# Patient Record
Sex: Female | Born: 1970
Health system: Southern US, Community
[De-identification: ages and names within clinical notes are randomized; demographics above are authoritative.]

## PROBLEM LIST (undated history)

## (undated) DIAGNOSIS — R109 Unspecified abdominal pain: Secondary | ICD-10-CM

## (undated) DIAGNOSIS — R002 Palpitations: Secondary | ICD-10-CM

## (undated) DIAGNOSIS — K219 Gastro-esophageal reflux disease without esophagitis: Secondary | ICD-10-CM

## (undated) DIAGNOSIS — N201 Calculus of ureter: Secondary | ICD-10-CM

## (undated) DIAGNOSIS — R10A1 Flank pain, right side: Secondary | ICD-10-CM

## (undated) DIAGNOSIS — D509 Iron deficiency anemia, unspecified: Secondary | ICD-10-CM

## (undated) DIAGNOSIS — J45909 Unspecified asthma, uncomplicated: Secondary | ICD-10-CM

## (undated) DIAGNOSIS — M069 Rheumatoid arthritis, unspecified: Secondary | ICD-10-CM

## (undated) DIAGNOSIS — J309 Allergic rhinitis, unspecified: Secondary | ICD-10-CM

## (undated) DIAGNOSIS — Z87442 Personal history of urinary calculi: Secondary | ICD-10-CM

## (undated) DIAGNOSIS — E039 Hypothyroidism, unspecified: Secondary | ICD-10-CM

---

## 1996-11-14 HISTORY — PX: TUBAL LIGATION: SHX77

## 2005-11-14 HISTORY — PX: PERCUTANEOUS NEPHROSTOLITHOTOMY: SHX2207

## 2006-03-06 ENCOUNTER — Ambulatory Visit: Payer: Self-pay | Admitting: Internal Medicine

## 2007-10-19 ENCOUNTER — Ambulatory Visit: Payer: Self-pay | Admitting: Family Medicine

## 2007-10-31 ENCOUNTER — Ambulatory Visit: Payer: Self-pay | Admitting: Gastroenterology

## 2008-07-14 ENCOUNTER — Encounter: Admission: RE | Admit: 2008-07-14 | Discharge: 2008-07-14 | Payer: Self-pay | Admitting: Endocrinology

## 2008-08-12 ENCOUNTER — Encounter (INDEPENDENT_AMBULATORY_CARE_PROVIDER_SITE_OTHER): Payer: Self-pay | Admitting: Interventional Radiology

## 2008-08-12 ENCOUNTER — Encounter: Admission: RE | Admit: 2008-08-12 | Discharge: 2008-08-12 | Payer: Self-pay | Admitting: Endocrinology

## 2008-08-12 ENCOUNTER — Other Ambulatory Visit: Admission: RE | Admit: 2008-08-12 | Discharge: 2008-08-12 | Payer: Self-pay | Admitting: Interventional Radiology

## 2008-08-18 ENCOUNTER — Ambulatory Visit: Payer: Self-pay | Admitting: Family Medicine

## 2008-10-22 ENCOUNTER — Ambulatory Visit: Payer: Self-pay | Admitting: Family Medicine

## 2008-11-14 HISTORY — PX: WISDOM TOOTH EXTRACTION: SHX21

## 2009-02-27 ENCOUNTER — Encounter: Admission: RE | Admit: 2009-02-27 | Discharge: 2009-02-27 | Payer: Self-pay | Admitting: Endocrinology

## 2009-08-20 ENCOUNTER — Ambulatory Visit: Payer: Self-pay | Admitting: Family Medicine

## 2009-12-24 ENCOUNTER — Ambulatory Visit: Payer: Self-pay | Admitting: Family Medicine

## 2010-01-19 ENCOUNTER — Ambulatory Visit: Payer: Self-pay | Admitting: Family Medicine

## 2010-05-12 ENCOUNTER — Encounter: Admission: RE | Admit: 2010-05-12 | Discharge: 2010-05-12 | Payer: Self-pay | Admitting: Anesthesiology

## 2010-06-01 ENCOUNTER — Ambulatory Visit (HOSPITAL_BASED_OUTPATIENT_CLINIC_OR_DEPARTMENT_OTHER): Admission: RE | Admit: 2010-06-01 | Discharge: 2010-06-01 | Payer: Self-pay | Admitting: Orthopedic Surgery

## 2010-06-01 HISTORY — PX: OTHER SURGICAL HISTORY: SHX169

## 2010-06-18 ENCOUNTER — Ambulatory Visit (HOSPITAL_COMMUNITY): Admission: RE | Admit: 2010-06-18 | Discharge: 2010-06-18 | Payer: Self-pay | Admitting: Anesthesiology

## 2010-07-21 ENCOUNTER — Encounter: Admission: RE | Admit: 2010-07-21 | Discharge: 2010-07-21 | Payer: Self-pay | Admitting: Anesthesiology

## 2010-08-20 ENCOUNTER — Ambulatory Visit: Payer: Self-pay

## 2010-10-14 HISTORY — PX: HYSTEROSCOPY W/ ENDOMETRIAL ABLATION: SUR665

## 2010-10-27 ENCOUNTER — Ambulatory Visit: Payer: Self-pay | Admitting: Obstetrics and Gynecology

## 2010-11-02 ENCOUNTER — Ambulatory Visit: Payer: Self-pay | Admitting: Obstetrics and Gynecology

## 2010-12-29 ENCOUNTER — Ambulatory Visit: Payer: Self-pay | Admitting: Family Medicine

## 2011-07-22 ENCOUNTER — Ambulatory Visit: Payer: Self-pay | Admitting: Family Medicine

## 2011-08-18 ENCOUNTER — Ambulatory Visit: Payer: Self-pay | Admitting: Family Medicine

## 2011-08-26 ENCOUNTER — Ambulatory Visit: Payer: Self-pay | Admitting: Allergy and Immunology

## 2012-01-18 ENCOUNTER — Ambulatory Visit: Payer: Self-pay | Admitting: Family Medicine

## 2012-06-14 ENCOUNTER — Ambulatory Visit: Payer: Self-pay | Admitting: Family Medicine

## 2012-08-19 ENCOUNTER — Emergency Department: Payer: Self-pay | Admitting: Emergency Medicine

## 2012-08-19 LAB — URINALYSIS, COMPLETE
Bacteria: NONE SEEN
Glucose,UR: NEGATIVE mg/dL (ref 0–75)
Nitrite: NEGATIVE
Specific Gravity: 1.035 (ref 1.003–1.030)
Squamous Epithelial: 2

## 2012-08-19 LAB — CBC
HCT: 41.7 % (ref 35.0–47.0)
MCHC: 33.7 g/dL (ref 32.0–36.0)
MCV: 80 fL (ref 80–100)
Platelet: 206 10*3/uL (ref 150–440)
RDW: 15.2 % — ABNORMAL HIGH (ref 11.5–14.5)
WBC: 8.9 10*3/uL (ref 3.6–11.0)

## 2012-08-19 LAB — BASIC METABOLIC PANEL
Anion Gap: 8 (ref 7–16)
BUN: 24 mg/dL — ABNORMAL HIGH (ref 7–18)
Co2: 26 mmol/L (ref 21–32)
Creatinine: 1.07 mg/dL (ref 0.60–1.30)
EGFR (African American): >60
EGFR (Non-African Amer.): >60
Glucose: 91 mg/dL (ref 65–99)

## 2012-08-22 ENCOUNTER — Encounter (HOSPITAL_BASED_OUTPATIENT_CLINIC_OR_DEPARTMENT_OTHER): Payer: Self-pay | Admitting: *Deleted

## 2012-08-22 ENCOUNTER — Other Ambulatory Visit: Payer: Self-pay | Admitting: Urology

## 2012-08-23 ENCOUNTER — Encounter (HOSPITAL_BASED_OUTPATIENT_CLINIC_OR_DEPARTMENT_OTHER): Payer: Self-pay | Admitting: *Deleted

## 2012-08-24 ENCOUNTER — Encounter (HOSPITAL_BASED_OUTPATIENT_CLINIC_OR_DEPARTMENT_OTHER): Payer: Self-pay | Admitting: *Deleted

## 2012-08-24 NOTE — Progress Notes (Signed)
NPO AFTER MN. ARRIVES AT 1015. NEEDS ISTAT, EKG, URINE PREG. WILL TAKE PREDNISONE , SYNTHROID AND DO ADVAIR INHALER , NASAL SPRAY AM OF SURG W/ SIPS OF WATER. IF NEEDED MAY TAKE PERCOCET AND PHENERGAN . WILL BRING RESCUE INHALER.

## 2012-08-25 ENCOUNTER — Ambulatory Visit: Payer: Self-pay | Admitting: Urology

## 2012-08-25 ENCOUNTER — Inpatient Hospital Stay: Payer: Self-pay | Admitting: Family Medicine

## 2012-08-25 LAB — URINALYSIS, COMPLETE
Leukocyte Esterase: NEGATIVE
Nitrite: NEGATIVE
Ph: 5 (ref 4.5–8.0)
Protein: NEGATIVE
RBC,UR: 89 /HPF (ref 0–5)

## 2012-08-25 LAB — BASIC METABOLIC PANEL
BUN: 20 mg/dL — ABNORMAL HIGH (ref 7–18)
Chloride: 103 mmol/L (ref 98–107)
Creatinine: 0.86 mg/dL (ref 0.60–1.30)
EGFR (African American): >60
EGFR (Non-African Amer.): >60
Glucose: 116 mg/dL — ABNORMAL HIGH (ref 65–99)
Osmolality: 285 (ref 275–301)
Potassium: 3.9 mmol/L (ref 3.5–5.1)
Sodium: 141 mmol/L (ref 136–145)

## 2012-08-25 LAB — CBC
MCH: 26.7 pg (ref 26.0–34.0)
MCHC: 33.7 g/dL (ref 32.0–36.0)

## 2012-08-26 LAB — CBC WITH DIFFERENTIAL/PLATELET
Basophil #: 0 10*3/uL (ref 0.0–0.1)
Lymphocyte %: 18.4 %
MCHC: 32.7 g/dL (ref 32.0–36.0)
Monocyte %: 10.9 %
Neutrophil %: 69.1 %
Platelet: 161 10*3/uL (ref 150–440)
RDW: 15.6 % — ABNORMAL HIGH (ref 11.5–14.5)
WBC: 7 10*3/uL (ref 3.6–11.0)

## 2012-08-26 LAB — BASIC METABOLIC PANEL
Calcium, Total: 8.1 mg/dL — ABNORMAL LOW (ref 8.5–10.1)
Chloride: 106 mmol/L (ref 98–107)
Glucose: 91 mg/dL (ref 65–99)
Osmolality: 278 (ref 275–301)
Potassium: 3.8 mmol/L (ref 3.5–5.1)

## 2012-08-27 ENCOUNTER — Encounter (HOSPITAL_BASED_OUTPATIENT_CLINIC_OR_DEPARTMENT_OTHER): Payer: Self-pay | Admitting: Anesthesiology

## 2012-08-27 ENCOUNTER — Encounter (HOSPITAL_BASED_OUTPATIENT_CLINIC_OR_DEPARTMENT_OTHER): Admission: RE | Payer: Self-pay | Source: Ambulatory Visit

## 2012-08-27 ENCOUNTER — Ambulatory Visit (HOSPITAL_BASED_OUTPATIENT_CLINIC_OR_DEPARTMENT_OTHER): Admission: RE | Admit: 2012-08-27 | Payer: 59 | Source: Ambulatory Visit | Admitting: Urology

## 2012-08-27 HISTORY — DX: Palpitations: R00.2

## 2012-08-27 HISTORY — DX: Unspecified abdominal pain: R10.9

## 2012-08-27 HISTORY — DX: Hypothyroidism, unspecified: E03.9

## 2012-08-27 HISTORY — DX: Unspecified asthma, uncomplicated: J45.909

## 2012-08-27 HISTORY — DX: Personal history of urinary calculi: Z87.442

## 2012-08-27 HISTORY — DX: Iron deficiency anemia, unspecified: D50.9

## 2012-08-27 HISTORY — DX: Allergic rhinitis, unspecified: J30.9

## 2012-08-27 HISTORY — DX: Rheumatoid arthritis, unspecified: M06.9

## 2012-08-27 HISTORY — DX: Flank pain, right side: R10.A1

## 2012-08-27 HISTORY — DX: Gastro-esophageal reflux disease without esophagitis: K21.9

## 2012-08-27 HISTORY — DX: Calculus of ureter: N20.1

## 2012-08-27 SURGERY — CYSTOSCOPY, WITH CALCULUS REMOVAL USING BASKET
Anesthesia: General | Laterality: Right

## 2012-08-27 NOTE — Anesthesia Preprocedure Evaluation (Deleted)
Anesthesia Evaluation  Patient identified by MRN, date of birth, ID band Patient awake    Reviewed: Allergy & Precautions, H&P , NPO status , Patient's Chart, lab work & pertinent test results  Airway       Dental   Pulmonary asthma ,          Cardiovascular - CAD, - Past MI and - CHF     Neuro/Psych negative neurological ROS  negative psych ROS   GI/Hepatic GERD-  Medicated,  Endo/Other  Hypothyroidism   Renal/GU      Musculoskeletal  (+) Arthritis -, Rheumatoid disorders,    Abdominal   Peds  Hematology  (+) Blood dyscrasia, anemia ,   Anesthesia Other Findings   Reproductive/Obstetrics                         Anesthesia Physical Anesthesia Plan  ASA: III  Anesthesia Plan: General   Post-op Pain Management:    Induction: Intravenous  Airway Management Planned: LMA  Additional Equipment:   Intra-op Plan:   Post-operative Plan: Extubation in OR  Informed Consent: I have reviewed the patients History and Physical, chart, labs and discussed the procedure including the risks, benefits and alternatives for the proposed anesthesia with the patient or authorized representative who has indicated his/her understanding and acceptance.   Dental advisory given  Plan Discussed with: CRNA  Anesthesia Plan Comments:         Anesthesia Quick Evaluation

## 2013-01-15 ENCOUNTER — Ambulatory Visit: Payer: Self-pay | Admitting: Family Medicine

## 2013-01-17 ENCOUNTER — Ambulatory Visit: Payer: Self-pay | Admitting: Family Medicine

## 2013-01-21 ENCOUNTER — Ambulatory Visit
Admission: RE | Admit: 2013-01-21 | Discharge: 2013-01-21 | Disposition: A | Discharge status: Home / Self Care | Payer: 59 | Source: Ambulatory Visit | Attending: Anesthesiology | Admitting: Anesthesiology

## 2013-01-21 ENCOUNTER — Other Ambulatory Visit: Payer: Self-pay | Admitting: Anesthesiology

## 2013-01-21 DIAGNOSIS — M25551 Pain in right hip: Secondary | ICD-10-CM

## 2013-04-01 ENCOUNTER — Other Ambulatory Visit: Payer: Self-pay | Admitting: Endocrinology

## 2013-04-01 DIAGNOSIS — E041 Nontoxic single thyroid nodule: Secondary | ICD-10-CM

## 2013-04-02 ENCOUNTER — Ambulatory Visit
Admission: RE | Admit: 2013-04-02 | Discharge: 2013-04-02 | Disposition: A | Discharge status: Home / Self Care | Payer: 59 | Source: Ambulatory Visit | Attending: Endocrinology | Admitting: Endocrinology

## 2013-04-02 DIAGNOSIS — E041 Nontoxic single thyroid nodule: Secondary | ICD-10-CM

## 2013-07-26 ENCOUNTER — Ambulatory Visit: Payer: Self-pay | Admitting: Family Medicine

## 2013-09-26 ENCOUNTER — Encounter: Payer: Self-pay | Admitting: Podiatry

## 2013-10-03 ENCOUNTER — Encounter: Payer: Self-pay | Admitting: Podiatrist

## 2013-10-03 ENCOUNTER — Ambulatory Visit (INDEPENDENT_AMBULATORY_CARE_PROVIDER_SITE_OTHER): Payer: 59 | Admitting: Podiatrist

## 2013-10-03 VITALS — BP 143/85 | HR 76 | Resp 16 | Ht 65.0 in | Wt 206.0 lb

## 2013-10-03 DIAGNOSIS — S92309A Fracture of unspecified metatarsal bone(s), unspecified foot, initial encounter for closed fracture: Secondary | ICD-10-CM | POA: Insufficient documentation

## 2013-10-03 NOTE — Progress Notes (Signed)
  Chief Complaint  Patient presents with  . Foot Pain    MY RIGHT FOOT IS BROKE     HPI: Patient is 42 y.o. female who presents today for a new problem- she has recently been diagnosed with a fracture of her 5th metatarsal of the right foot.  Selena Batten has rheumatoid arthritis and does not recall when she may have injured her foot.  Xrays were taken 09/23/2013 by Dr Zenovia Jordan at [facility] which revealed a fractured 5th metatarsal base with no known injury.  Patient has been wearing her normal shoes.  Overall she relates diffuse discomfort but no specific area of pain the the right foot.  Selena Batten states her left foot is doing fine after this fracture has healed.       Physical Exam  GENERAL APPEARANCE: Alert, conversant. Appropriately groomed. No acute distress.  VASCULAR: Pedal pulses palpable and strong bilateral.  Capillary refill time is immediate to all digits,  Proximal to distal cooling it warm to warm.  Digital hair growth is present bilateral  NEUROLOGIC: sensation is intact epicritically and protectively to 5.07 monofilament at 5/5 sites bilateral.  Light touch is intact bilateral, vibratory sensation intact bilateral, achilles tendon reflex is intact bilateral.  MUSCULOSKELETAL: acceptable muscle strength, tone and stability bilateral.  Intrinsic muscluature intact bilateral.  Rectus appearance of foot and digits noted bilateral. Slight swelling at the 5th metatarsal region of the right foot is noted.  Some discomfort over the area of fracture is also seen.  No obvious deformity present  DERMATOLOGIC: skin color, texture, and turger are within normal limits.  No preulcerative lesions are seen, no interdigital maceration noted.  No open lesions present.   X-rays were reviewed and are positive for a fracture of the 5th metatarsal at the proximal diaphyseal region of the base.  No dislocation present.    Assessment: Fracture of the 5th metatarsal base right foot  Plan:   The patient has a short air fracture walker from the previous fracture of her left foot. I recommended that she wear this boot on her right foot at all times during weightbearing. I will see her back in one month for followup and re\re x-ray of this right foot. She finds that her boot at home is uncomfortable or worn out, she will let us know and I will get her a new boot for her right foot.   Victorina Kable P

## 2013-10-03 NOTE — Patient Instructions (Signed)
Wear your short air fracture walker any time your foot hits the floor-  It is important to keep weight off of your fracture.

## 2013-10-14 ENCOUNTER — Encounter: Payer: Self-pay | Admitting: Podiatrist

## 2013-10-31 ENCOUNTER — Ambulatory Visit (INDEPENDENT_AMBULATORY_CARE_PROVIDER_SITE_OTHER): Payer: 59

## 2013-10-31 ENCOUNTER — Ambulatory Visit (INDEPENDENT_AMBULATORY_CARE_PROVIDER_SITE_OTHER): Payer: 59 | Admitting: Podiatrist

## 2013-10-31 ENCOUNTER — Encounter: Payer: Self-pay | Admitting: Podiatrist

## 2013-10-31 VITALS — BP 144/86 | HR 77 | Resp 16 | Ht 65.0 in | Wt 204.0 lb

## 2013-10-31 DIAGNOSIS — M79609 Pain in unspecified limb: Secondary | ICD-10-CM

## 2013-10-31 DIAGNOSIS — M79671 Pain in right foot: Secondary | ICD-10-CM

## 2013-10-31 DIAGNOSIS — S92309A Fracture of unspecified metatarsal bone(s), unspecified foot, initial encounter for closed fracture: Secondary | ICD-10-CM

## 2013-10-31 NOTE — Progress Notes (Signed)
Subjective: Patient presents today for followup of fifth metatarsal fracture of the right foot. She states she's been wearing her boot and the foot been feeling better. She has been offloading this foot since I saw her at the last visit about 3 weeks ago. She also states that she recently fractured her left knee and she's found out today. In the past she's had another fracture on her foot and she's been checked for bone density she's getting ready to have another test   objective: Neurovascular status is unchanged discomfort along the right fifth metatarsal is noted. X-rays taken and evaluated and reveal a fracture still present  Assessment: Fracture fifth metatarsal right foot mid diaphyseal region  Plan: Instructed the patient to wear the boot for another month and I'll see her back then for another x-ray discussed her bone density and the question as to why she's fracturing. She's going to set up a visit again for another bone density test and hopefully this will add to the diagnosis and reasoning behind her fractures.

## 2013-10-31 NOTE — Patient Instructions (Signed)
Keep wearing your boot for the next 4 weeks consistently

## 2013-11-28 ENCOUNTER — Encounter: Payer: Self-pay | Admitting: Podiatrist

## 2013-11-28 ENCOUNTER — Ambulatory Visit (INDEPENDENT_AMBULATORY_CARE_PROVIDER_SITE_OTHER): Payer: 59 | Admitting: Podiatrist

## 2013-11-28 ENCOUNTER — Ambulatory Visit (INDEPENDENT_AMBULATORY_CARE_PROVIDER_SITE_OTHER): Payer: 59

## 2013-11-28 VITALS — BP 136/80 | HR 58 | Resp 16 | Ht 65.0 in | Wt 198.0 lb

## 2013-11-28 DIAGNOSIS — M79609 Pain in unspecified limb: Secondary | ICD-10-CM

## 2013-11-28 DIAGNOSIS — M79672 Pain in left foot: Secondary | ICD-10-CM

## 2013-11-28 DIAGNOSIS — S92309A Fracture of unspecified metatarsal bone(s), unspecified foot, initial encounter for closed fracture: Secondary | ICD-10-CM

## 2013-11-28 DIAGNOSIS — M79673 Pain in unspecified foot: Secondary | ICD-10-CM

## 2013-11-28 NOTE — Patient Instructions (Signed)
Continue to wear the boot on your right foot-- i will re-xray the foot then and if it hasn't healed completely, we can consider a bone stimulator to help it progress faster.

## 2013-11-28 NOTE — Progress Notes (Addendum)
Subjective: Carrie Santiago presents today for followup fracture fifth metatarsal right foot.  She states the right foot feeling okay however now she has pain again in the left foot. She previously had a fracture that went on to heal at the second metatarsal left foot. She denies any trauma or injury to either foot. She states she has been wearing the boot as instructed on the right foot.   Objective: Neurovascular status intact and unchanged bilaterally. No pain present along the lateral aspect of the right foot. No swelling or ecchymosis is present. The left foot does have pain midshaft of the second metatarsal and dorsally with pressure. X-rays are taken of bilateral feet and reveal a non-healing fracture of the fifth metatarsal right, and a recurrence of fracture second metatarsal left foot.  Assessment: Fifth metatarsal fracture right- nonhealing, relapsing second metatarsal fracture  Plan: Recommended continued use of the air fracture walker on the right foot. We'll recommend use of a surgical shoe on the left as well. I will see her back in one month in because of her issue with fracture healing I do believe a bone stimulator would be beneficial. I will go ahead and check on benefits for bone stimulator at this time and will notify the patient as well.

## 2013-11-29 ENCOUNTER — Telehealth: Payer: Self-pay | Admitting: *Deleted

## 2013-11-29 NOTE — Telephone Encounter (Signed)
Marlowe AschoffKathryn Egerton, DPM Birdie HopesAlicia Gale Zetta BillsSmith            Aleynah Rocchio,   Call Cala BradfordKimberly and let her know that the 2nd metatarsal of the left foot appears to have re-fractured where it was healing. Tell her to go back into a surgical shoe for this foot (if she doesn't have one she can come pick one up)   Also, can you call Thurston Poundsrey for a check on a bone stimulator? Let Cala BradfordKimberly know we are going to go ahead and look into this for her now.   Thanks! E      Called and left message on cell phone regarding the above information. Called and spoke with trey, trey stated he would go by [place] office and get dr. Irving ShowsEgerton signature and get all demographics printed at that location.

## 2013-12-03 DIAGNOSIS — S92309A Fracture of unspecified metatarsal bone(s), unspecified foot, initial encounter for closed fracture: Secondary | ICD-10-CM

## 2013-12-19 ENCOUNTER — Ambulatory Visit: Payer: Self-pay | Admitting: Family Medicine

## 2013-12-26 ENCOUNTER — Ambulatory Visit (INDEPENDENT_AMBULATORY_CARE_PROVIDER_SITE_OTHER): Payer: 59 | Admitting: Podiatrist

## 2013-12-26 ENCOUNTER — Ambulatory Visit (INDEPENDENT_AMBULATORY_CARE_PROVIDER_SITE_OTHER): Payer: 59

## 2013-12-26 VITALS — BP 124/76 | HR 69 | Resp 16 | Ht 65.0 in | Wt 197.0 lb

## 2013-12-26 DIAGNOSIS — M79609 Pain in unspecified limb: Secondary | ICD-10-CM

## 2013-12-26 DIAGNOSIS — M79673 Pain in unspecified foot: Secondary | ICD-10-CM

## 2013-12-26 DIAGNOSIS — S92309A Fracture of unspecified metatarsal bone(s), unspecified foot, initial encounter for closed fracture: Secondary | ICD-10-CM

## 2013-12-27 NOTE — Progress Notes (Signed)
Subjective: Carrie Santiago presents today for followup of fracture fifth metatarsal base right and re\re fracture second metatarsal left. She states that she has been using the bone stimulator on her right foot and that her left foot has been improving. She has been wearing her [employer] Shoes on her left foot and her air fracture walker on her right. She's been on long-term prednisone use   Objective: Neurovascular status intact and unchanged. Minimal discomfort at the base of the fifth metatarsal noted. No redness, no swelling, no ecchymosis present. Minimal discomfort midshaft left second metatarsal. No redness, no swelling, no ecchymosis present. X-rays show fracture fifth metatarsal right and second metatarsal left  Assessment: Fracture metatarsal 5 right and second metatarsal left Plan: Recommended the use of bone stimulator on both feet. Recommended she start to wear the boot on her left foot and wean out of it on her right. I will see her back in one month for rex-ray and recheck of fractures.

## 2014-02-03 ENCOUNTER — Ambulatory Visit: Payer: Self-pay | Admitting: Family Medicine

## 2014-02-20 ENCOUNTER — Ambulatory Visit (INDEPENDENT_AMBULATORY_CARE_PROVIDER_SITE_OTHER): Payer: 59

## 2014-02-20 ENCOUNTER — Ambulatory Visit (INDEPENDENT_AMBULATORY_CARE_PROVIDER_SITE_OTHER): Payer: 59 | Admitting: Podiatrist

## 2014-02-20 VITALS — Resp 16 | Ht 65.0 in | Wt 196.0 lb

## 2014-02-20 DIAGNOSIS — M79673 Pain in unspecified foot: Secondary | ICD-10-CM

## 2014-02-20 DIAGNOSIS — M79609 Pain in unspecified limb: Secondary | ICD-10-CM

## 2014-02-20 DIAGNOSIS — IMO0001 Reserved for inherently not codable concepts without codable children: Secondary | ICD-10-CM

## 2014-02-20 DIAGNOSIS — S92309A Fracture of unspecified metatarsal bone(s), unspecified foot, initial encounter for closed fracture: Secondary | ICD-10-CM

## 2014-02-20 DIAGNOSIS — S92354G Nondisplaced fracture of fifth metatarsal bone, right foot, subsequent encounter for fracture with delayed healing: Secondary | ICD-10-CM

## 2014-02-20 NOTE — Patient Instructions (Signed)
Keep using your bone stimulator on your right foot.  Your left foot is looking better on xray but your right is lagging behind a bit.  We will decide if it needs a screw placed across it at the next visit.

## 2014-02-20 NOTE — Progress Notes (Signed)
Subjective: Cala BradfordKimberly presents today for followup of second metatarsal fracture left; and fifth metatarsal fracture right. She states that the left foot is feeling okay but now she's having more pain on the right foot. She denies any new trauma or injury to the foot. She has been wearing a Darco shoe on the right foot and a normal shoe on the left.  Objective: Neurovascular status is unchanged. Clinical appearance of bilateral feet is normal. Mild pain with pressure at the fifth metatarsal at the area of fracture is noted. No pain second metatarsal elicited. X-rays taken bilaterally today. Fifth metatarsal fracture appears unchanged. No improvement in healing is seen on x-ray.  Assessment: Fracture fifth metatarsal right nonhealing fracture second metatarsal left  Plan: Recommended she go back into the boot on the right foot. Recommended continued use of her bone stimulator. Recommended a screw fixation on the fifth metatarsal due to the fact that it is not healing and because of the location of the fracture. Her husband is getting ready to have back surgery and this is not a good time for her to have surgery herself. I will see her back in 4-6 weeks for re\re evaluation and re\re x-ray and we will decide at that point if surgery is still recommended.

## 2014-04-03 ENCOUNTER — Ambulatory Visit (INDEPENDENT_AMBULATORY_CARE_PROVIDER_SITE_OTHER): Payer: 59

## 2014-04-03 ENCOUNTER — Ambulatory Visit (INDEPENDENT_AMBULATORY_CARE_PROVIDER_SITE_OTHER): Payer: 59 | Admitting: Podiatrist

## 2014-04-03 VITALS — BP 128/84 | HR 68 | Resp 16

## 2014-04-03 DIAGNOSIS — S92309A Fracture of unspecified metatarsal bone(s), unspecified foot, initial encounter for closed fracture: Secondary | ICD-10-CM

## 2014-04-03 NOTE — Patient Instructions (Signed)
Get a pair of hiking type shoes from [employer] or an outdoor store to help support your injured feet.  If they start to hurt again, put the boot back on--

## 2014-04-05 NOTE — Progress Notes (Signed)
Subjective: Carrie Santiago presents today for followup of second metatarsal fracture left; and fifth metatarsal fracture right. She states that both of her feet are doing well.  She continues to wear the sort air fracture walker on the right foot. denies any new trauma or injury to the foot. She wears a normal shoe on the left.  She is on predinsone and methotrexate which slows her healing potential  Objective: Neurovascular status is unchanged. Clinical appearance of bilateral feet is normal. no pain with pressure at the fifth metatarsal at the area of fracture is noted. No pain second metatarsal elicited. X-rays taken bilaterally today. Fifth metatarsal fracture appears to be improved on xray-- the central portion of the fracture is healing while the most lateral aspect is still open.   Assessment: Fracture fifth metatarsal right nonhealing fracture second metatarsal left   Plan:Recommended continued use of her bone stimulator. Discussed weaning out of the boot into a darco shoe in a couple of weeks.  She will let me know if the pain returns.  In the past we discussed surgery however at this time, it finally appears to be improving.

## 2015-01-09 ENCOUNTER — Ambulatory Visit: Payer: Self-pay | Admitting: Podiatrist

## 2015-03-03 NOTE — Consult Note (Signed)
Chief Complaint:   Subjective/Chief Complaint HD #1 Right Ureteral Stone with Refractory Colic  No new complaints. Persistent RLQ/Flank pain requiring parenteral narcotics - nausea with po Dilaudid.   VITAL SIGNS/ANCILLARY NOTES: **Vital Signs.:   13-Oct-13 06:48   Vital Signs Type Routine   Temperature Temperature (F) 98   Celsius 36.6   Temperature Source Oral   Pulse Pulse 69   Respirations Respirations 18   Systolic BP Systolic BP 263   Diastolic BP (mmHg) Diastolic BP (mmHg) 71   Mean BP 88   Pulse Ox % Pulse Ox % 94   Pulse Ox Activity Level  At rest   Oxygen Delivery Room Air/ 21 %  *Intake and Output.:   Shift 13-Oct-13 07:00   Grand Totals Intake:  1479 Output:  550    Net:  929 24 Hr.:  -194   Oral Intake      In:  120   IV (Primary)      In:  1359   Urine ml     Out:  550   Length of Stay Totals Intake:  1881 Output:  2075    Net:  -194    Shift 15:00   Grand Totals Intake:  240 Output:  150    Net:  90 24 Hr.:  90   Oral Intake      In:  240   Urine ml     Out:  150   Length of Stay Totals Intake:  2121 Output:  2225    Net:  -104   Brief Assessment:   Additional Physical Exam WDWN WF in NAD, appearing tired. nL Respiratory Effort   Lab Results: Routine Chem:  13-Oct-13 05:09    Glucose, Serum 91   BUN 11   Creatinine (comp) 0.96   Sodium, Serum 140   Potassium, Serum 3.8   Chloride, Serum 106   CO2, Serum 28   Calcium (Total), Serum  8.1   Anion Gap  6   Osmolality (calc) 278   eGFR (African American) >60   eGFR (Non-African American) >60 (eGFR values <8m/min/1.73 m2 may be an indication of chronic kidney disease (CKD). Calculated eGFR is useful in patients with stable renal function. The eGFR calculation will not be reliable in acutely ill patients when serum creatinine is changing rapidly. It is not useful in  patients on dialysis. The eGFR calculation may not be applicable to patients at the low and high extremes of body sizes,  pregnant women, and vegetarians.)  Routine Hem:  13-Oct-13 05:09    WBC (CBC) 7.0   RBC (CBC) 4.81   Hemoglobin (CBC) 12.6   Hematocrit (CBC) 38.5   Platelet Count (CBC) 161   MCV 80   MCH 26.2   MCHC 32.7   RDW  15.6   Neutrophil % 69.1   Lymphocyte % 18.4   Monocyte % 10.9   Eosinophil % 1.4   Basophil % 0.2   Neutrophil # 4.8   Lymphocyte # 1.3   Monocyte # 0.8   Eosinophil # 0.1   Basophil # 0.0 (Result(s) reported on 26 Aug 2012 at 05:56AM.)   Assessment/Plan:  Assessment/Plan:   Assessment Right Ureteral Stone with Refractory Colic -continues to require parenteral narcotics -pt opts to continue with parenteral pain management until tomorrow morning when she is scheduled [facility]. No new recommendations 2. Will sign off - please reconsult as needed   Electronic  Signatures: Darcella Cheshire (MD)  (Signed 13-Oct-13 11:42)  Authored: Chief Complaint, VITAL SIGNS/ANCILLARY NOTES, Brief Assessment, Lab Results, Assessment/Plan   Last Updated: 13-Oct-13 11:42 by Darcella Cheshire (MD)

## 2015-03-03 NOTE — Discharge Summary (Signed)
PATIENT NAME:  Carrie CampbellORRIS, Salley J MR#:  [id number] DATE OF BIRTH:  27-Jan-1971  DATE OF ADMISSION:  08/25/2012 DATE OF DISCHARGE:  08/26/2012  DISPOSITION: Home.   FOLLOW-UP: [facility] at 10:30.   DIAGNOSES: 1. Nephrolithiasis. 2. Hydronephrosis. 3. Rheumatoid arthritis.  4. Intractable pain. 5. Severe nausea and vomiting.   6. Asthma.  7. Iron deficiency anemia. 8. Hyperglycemia due to steroids.  9. Dehydration, improved after IV fluids given.  MEDICATIONS AT DISCHARGE:  1. Prednisone 5 mg once daily.  2. Synthroid 150 mcg once a day.  3. Singulair 10 mg once daily.  4. Vicoprofen 7.5/200 mg oral tablet. This was stopped for possible procedure, although the patient will not require the procedure anymore for what she can restart this medication. 5. Ventolin HFA 90 mcg inhaled. 6. Advair Diskus 250/50 twice daily. 7. Vitamin B12.  8. Vitamin D3. 9. Iron once daily.  10. Methotrexate 25 mg injectable once weekly on Tuesday. 11. Folic acid once daily.  12. Oxycodone 5 mg every eight hours p.r.n. pain. 13. Phenergan 25 mg p.r.n. pain. 14. Flomax 0.4 mg once a day. 15. Hydroxychloroquine 200 mg once daily.  16. Leflunomide 20 mg once daily.  17. Enbrel 50 mg subcutaneously once weekly.   HOSPITAL COURSE: [name] is a very nice 44 year old female who has nephrolithiasis. She was seen in the ER a couple of days prior to this admission and discharged with pain medications and nausea medication. While the patient was at home, she was not able to tolerate the pain. She had an appointment to have the stone removed and stent placement on Monday but she could not tolerate it and couldn't wait until then. The patient was admitted for pain control for severe dehydration, intractable pain, nausea, and vomiting. Urology was consulted. They were not able to proceed during this hospital stay and decided just to leave her in the hospital for pain control. The  patient, fortunately, passed her stone today. It was a large stone and she had complete resolution of the symptoms after the stone passed. At this moment she is able to be discharged. I recommend her to continue to keep her appointment [facility] in case there is a need for further procedures.    TIME SPENT: I spent about 35 minutes with this patient today.   ____________________________ Felipa Furnaceoberto Sanchez Gutierrez, MD rsg:drc D: 08/26/2012 16:28:31 ET T: 08/27/2012 10:10:32 ET JOB#: [id number]  cc: Felipa Furnaceoberto Sanchez Gutierrez, MD, <Dictator> Yuepheng Schaller Juanda ChanceSANCHEZ GUTIERRE MD ELECTRONICALLY SIGNED 08/28/2012 16:52

## 2015-03-03 NOTE — Consult Note (Signed)
Urology Consultation Report: for Consultation: Obstructing Right Ureteral Stone with Refractory Pain, Nausea/Vomiting MD: Felipa Furnaceoberto Sanchez Gutierrez, M.D.MD: Marin OlpJay H. Rayquan Amrhein, M.D. Nilda SimmerKristi Smith, M.D.Local Urologist: [facility] 44 y.o. MWF with h/o recurrent urolithiasis who was diagnosed with an obstructing 6.55mm right ureteral stone on CT 08/19/2012 [facility]. The pt was released from the ER with Percocet prn, Phenergan prn, nitrofurantoin bid and tamsulosin. [place] [facility] [place] and is scheduled for definitive ureteroscopy on Monday, 08/27/2012. Last night, she developed progression in her colic from a 7/10 to a 10/10 without relief with Percocet 2 po q4hrs. She also was unable to keep down solid food, but has been able to tolerate liquids. [facility] this morning and has required parenteral narcotics for pain control. She denies any dysuria or F/C and does not have a leukocystosis (WBC 7.7k). She has been admitted for IV Hydration and pain control. stone history: 1st stone in 2004 - passed spontaneously (no analysis performed). Second stone episode was in 2007 and [facility]. A metabolic stone risk analysis was performed and the pt reports that she was advised to moderate her coffee/tea intake. reviewed and is as stated in the H&P by Dr. Sharen HonesGutierrez with the addition of a FH of Urolithiasis in the pt's mother, father and paternal grandfather. With regard to the pt's medications, she states that she has been on Prednisone 5mg  po qDay for the past 3 months and that while on Prednisone, her methrotrexate and Enbrel had been held until the past 2 weeks. They were not administered this past week in anticipation of her stone surgery Monday. VS at 10:21am: 97.78F P78 BP 161/87 O2 sat 99% on RA WDWN WF in NAD, appearing tiredWarm/Dry, no lesions about the head/neckNC/AT, EOMI, Anictericno masses/bruitsCTA, nL  Respiratory EffortRRR without M/G/R, 2+ radial pulses b/LSoft, ND, tender RLQ without guarding/rebound, mild right CVAT; no masses or organomegalyno edema, nL ROM, NTnon-focalAxO x4, Appropriate BUN/Cr (20/0.86); Ca++ (8.9); WBC (7.7k) Right Ureteral Colic - difficulty with pain management and N/V, but no indication of infectionoptions with the pt: trial of alternate PO pain/nausea medications vs Ureteral Stenting -the would like to try a trial of alternate PO pain/nausea medications  Dilaudid 2-4 mg po q3-4 hrs prn (ordered)Zofran 4mg  po q4-6 hrs prn (ordered)Continue tamsulosinContinue to strain all urineConsider right ureteral stent placement if with persistent inadequate pain/nausea controlContinue IV Hydration you.  Electronic Signatures: Marin OlpKim, Regino Fournet H (MD)  (Signed on 12-Oct-13 13:26)  Authored  Last Updated: 12-Oct-13 13:26 by Marin OlpKim, Stephenia Vogan H (MD)

## 2015-03-03 NOTE — H&P (Signed)
PATIENT NAME:  Carrie Santiago, Carrie Santiago MR#:  [id number] DATE OF BIRTH:  [birth year]  DATE OF ADMISSION:  08/25/2012  PRIMARY CARE PHYSICIAN: Nilda Simmer, MD  CHIEF COMPLAINT: Right flank pain.   HISTORY OF PRESENT ILLNESS: Ms. Duchesne is a very nice 44 year old female who has history of rheumatoid arthritis, asthma, hypothyroidism, and previous kidney stones. The patient is here today with history of right flank pain that has been going on for the last week. She presented on 08/19/2012 to an urgent care and was transferred to this emergency department due to significant pain that was intractable. At that moment she was diagnosed with a kidney stone. A CT scan done in the emergency room showed mild to moderate right hydronephrosis secondary to a large calculus within the mid to proximal right ureter, possible small stone in the gallbladder as well. The patient was referred to urology and she has been scheduled to have stent placement this following Monday. The patient states that her pain just became intolerable for what she has been taking pain medications in the past. She has been taking Vicoprofen due to the kidney stones, but also due to rheumatoid arthritis and she was asked to stop it due to the possible procedure that is coming up on Monday. For that the patient has been taking OxyContin and she does not seem to have significant relief of the pain. Her pain is located in her right flank and occasionally radiates to her upper abdomen. The pain is intolerable, it is sharp, and its intensity is from 7 to 10/10; at this moment it is 7 out of 10. The patient has been getting IV medications and they seem to help, but they do not seem to relieve the pain completely. The patient has undergone previous procedures for kidney stone in the past. In 2007 the patient had failure to remove the stone through a stent placed for what she needed to have a nephrostomy tube placed and surgical removal of the stone. Since then she  has not been having that much trouble or major kidney stone episodes until now. The patient denies any fever, but she has been very nauseous and she has significant dehydration.   REVIEW OF SYSTEMS: A 12-system review of systems is done. CONSTITUTIONAL: The patient denies any fever, fatigue, or weakness. She has significant pain in her joints and now in her flank and abdomen. No significant weight loss or significant weight gain, although her weight varies especially now that she is on steroids. EYES: Denies any significant blurry vision or eye pain or glaucoma. ENT: Denies any tinnitus, any difficulty swallowing, any sinus pain or nasal congestion. RESPIRATORY: The patient does have history of asthma and she occasionally uses her rescue inhaler. The last time she used it was yesterday, only one time. She has occasional wheezing and congestion of the chest. No pneumonia. No significant dyspnea or hemoptysis. CARDIOVASCULAR: No chest pain or orthopnea. No syncopal episodes. No significant edema of the lower extremities. GASTROINTESTINAL: Denies any hematochezia, melena, or ulcers. She has mild GERD. She denies any diarrhea. The patient has been constipated due to pain medications, but her last bowel movement was yesterday. Positive nausea and vomiting. No jaundice. GU: The patient denies any significant dysuria. She has kidney stones as mentioned above. She has increased frequency and mild incontinence which is stress incontinence whenever she sneezes or does any heavy lifting. She denies any sexually transmitted diseases, breast problems, or vaginal discharge. ENDOCRINE: No polyuria, polydipsia, or polyphagia. No cold or  heat intolerance. She does take Synthroid for her thyroid, but it has been well controlled. HEMATOLOGIC/LYMPHATIC: Positive chronic iron deficiency anemia. She is status post colonoscopy which did not show any significant lesions to make her have gastrointestinal bleeding. No easy bruising. No  swollen glands. MUSCULOSKELETAL: Positive for multiple joint pains, especially hands and feet. The patient has rheumatoid arthritis. No gout. NEUROLOGIC: No numbness, weakness, no transient ischemic attack, and no headaches. PSYCHOLOGIC: No significant depression or anxiety.   PAST MEDICAL HISTORY:  1. Rheumatoid arthritis.  2. Hypothyroidism.  3. History of kidney stones.  4. Asthma.  5. Iron deficiency anemia.  6. Stress incontinence.   ALLERGIES: No known drug allergies.   MEDICATIONS:  1. Vitamin D3 1000 units once a day. 2. Vitamin B12 500 units once daily.  3. Ventolin two puffs. 4. Synthroid 150 mcg once daily. 5. Q-Nasal spray.  6. Prednisone 5 mg once daily.  7. Phenergan 25 mg every four hours p.r.n. nausea.  8. Oxycodone 5 mg as needed for pain. 9. Montelukast 10 mg once daily. 10. Methotrexate 25 mg injectable every week on Tuesday. 11. Leflunomide 20 mg once daily.  12. Iron 28 mg once daily.  13. Hydroxychloroquine 200 mg once a day. 14. Hydrocodone with ibuprofen every 6 hours; the patient has stopped this medication. 15. Folic acid 2 mg once daily.  16. Flomax 0.4 mg once daily. 17. Enbrel 1 mL subcutaneously every week on Wednesday. 18. Advair Diskus 250/50 one puff every 12 hours.   FAMILY HISTORY: Positive for coronary artery disease in her grandfather who had a myocardial infarction.  No history of cancer. Positive history of diabetes in several members of her family.   SOCIAL HISTORY: The patient used to smoke but she quit over 10 years ago. She smoked probably five cigarettes for a period of 5 to 10 years. Alcohol - she states that she drinks very rarely. She denies any IV drug abuse. She works as a Scientist, research (medical)hairstylist and she lives with her husband.   PAST SURGICAL HISTORY:  1. Bilateral tubal ligation. 2. Lithotripsy with nephrostomy tube and surgical removal of kidney stone in 2007.  PHYSICAL EXAMINATION:   VITAL SIGNS: Blood pressure 161/95, pulse 79, and  respiratory rate 18. The patient is afebrile.  GENERAL: The patient is alert and oriented x3. No acute distress. No respiratory distress. She is lethargic after IV pain medications are given but easily arousable. She looks very comfortable in bed and she is mildly dehydrated.   HEENT: Her pupils are equal and reactive. Extraocular movements are intact. Mucosa is dry. Anicteric sclerae. Pink conjunctivae. Normocephalic atraumatic. No oral lesions. No oropharyngeal exudates. No thrush.   NECK: Supple. No JVD. No thyromegaly. No adenopathy. No stridor. No carotid bruits are auscultated. Normal range of motion.   CARDIOVASCULAR: Regular rate and rhythm. No murmurs, rubs, or gallops are appreciated.  Apical impulse heart tones along midclavicular line.   PULMONARY: Clear without any wheezing or crepitus. No use of accessory muscles. No dullness to percussion.   ABDOMEN: Soft, nontender, and nondistended. No hepatosplenomegaly. No masses. Bowel sounds are positive. Positive right costovertebral angle tenderness. No rebound. No guarding. No hepatic stigmata.   GENITAL: Negative for external genital lesions.   EXTREMITIES: No significant edema, cyanosis, or swelling joints.   JOINTS: There is no significant joint swelling at this moment or erythema. No major joint deformity. No swollen hands.  NEUROLOGIC: Cranial nerves II through XII intact. No focal abnormalities. Sensation is normal.   SKIN:  No petechiae or rashes.   PSYCHIATRIC: Flat affect and the patient is mildly sedated, but no significant agitation.   LYMPHATIC: No significant lymphadenopathy in the neck, supraclavicular, or epitrochlear regions.   LABS/RADIOLOGIC STUDIES: Blood sugar 116, BUN 20, creatinine 0.86, chloride 103, and sodium 141. White count 7.7 and hemoglobin 13.4.  URINALYSIS: Red blood cells are positive. Negative nitrites and leukocyte esterase. White blood cells are only 3.  CT scan, as mentioned above, was done on  08/19/2012 and has right hydronephrosis and kidney stones.   ASSESSMENT AND PLAN:  1. Nephrolithiasis. The patient has a kidney stone on the right kidney and mild to moderate hydronephrosis and a calculus that is located in the mid proximal right ureter. In the past, the patient has had failure to get these stones removed and bilateral ureters with stents and she has required nephrostomy too and removal of the stents surgically. At this moment, the patient is scheduled to have a stent placed on Monday, but her pain is intractable and she needs to be admitted for IV pain medications for what urology has been consulted. The case was discussed with Dr. Selena Batten via the ER physician and Dr. Selena Batten has asked [place], hospitalist, to admit the patient and see if the patient can have pain control and possibly he can do a procedure over the weekend. If not at least we can arrange for pain control. There is no significant urinary retention at this moment and the patient's creatinine is actually better than it was on previous labs, at 0.86. Her BUN is 20, previous to that it was 24.  2. Intractable pain. Pain control has been obtained or almost achieved with Demerol and hydromorphone. I think at this moment we are just going to continue hydromorphone and try to get her pain better.  3. Dehydration. The patient looks moderately dehydrated due to the fact that she has not been eating. She has been very nauseated and has had vomiting. We are going to put her on IV fluids and advance diet slowly.  4. Rheumatoid arthritis. Continue Enbrel, continue leflunomide, continue methotrexate and prednisone. Pain control as needed. 5. Hyperglycemia. The patient is on steroids. Her glucose is only 116. We are going to observe and add on insulin sliding scale if necessary. I am not going to check any Accu-Cheks on her right now. We are just going to get her labs tomorrow in the morning.  6. Asthma. Continue p.r.n. nebulizers. Continue montelukast  and Advair.  7. Stress incontinence. The patient has been recently started on Flomax. We will continue this medication.  8. Anemia. Actually hemoglobin is normal. We will continue just iron supplementation.  9. Her other medical problems seem to be stable. For deep vein thrombosis prophylaxis, we are going to use TED hose. For GI prophylaxis we are going to add a PPI. The patient is a FULL CODE.   TIME SPENT: About 45 minutes. ____________________________ Felipa Furnace, MD rsg:slb D: 08/25/2012 09:16:21 ET T: 08/25/2012 09:59:18 ET JOB#: [id number]  cc: Felipa Furnace, MD, <Dictator> Myrle Sheng. Katrinka Blazing, MD Regan Rakers Juanda Chance MD ELECTRONICALLY SIGNED 08/28/2012 16:52

## 2015-07-24 ENCOUNTER — Telehealth: Payer: Self-pay | Admitting: Physician Assistant

## 2015-07-24 NOTE — Telephone Encounter (Signed)
Spoke with Daiva Nakayama and information given to patient all questions were answered.  Thanks,  -Anaijah Augsburger

## 2015-07-24 NOTE — Telephone Encounter (Signed)
Pt would like to speak with a nurse to see what the signe and symptoms of Hep A would be. The reason she is asking is because the news had showed that [employer] had some bad strawberries that was causing people to have Hep A. Pt would just like to speak with a nurse because she stated she and her husband eat there a lot. Thanks TNP

## 2015-12-22 ENCOUNTER — Other Ambulatory Visit: Payer: Self-pay | Admitting: Physician Assistant

## 2015-12-22 DIAGNOSIS — M0579 Rheumatoid arthritis with rheumatoid factor of multiple sites without organ or systems involvement: Secondary | ICD-10-CM

## 2015-12-22 DIAGNOSIS — E039 Hypothyroidism, unspecified: Secondary | ICD-10-CM

## 2015-12-22 DIAGNOSIS — R5381 Other malaise: Secondary | ICD-10-CM

## 2016-12-05 DIAGNOSIS — M0579 Rheumatoid arthritis with rheumatoid factor of multiple sites without organ or systems involvement: Secondary | ICD-10-CM | POA: Diagnosis not present

## 2016-12-13 DIAGNOSIS — G894 Chronic pain syndrome: Secondary | ICD-10-CM | POA: Diagnosis not present

## 2016-12-13 DIAGNOSIS — Z79891 Long term (current) use of opiate analgesic: Secondary | ICD-10-CM | POA: Diagnosis not present

## 2016-12-13 DIAGNOSIS — M47812 Spondylosis without myelopathy or radiculopathy, cervical region: Secondary | ICD-10-CM | POA: Diagnosis not present

## 2016-12-14 DIAGNOSIS — M0579 Rheumatoid arthritis with rheumatoid factor of multiple sites without organ or systems involvement: Secondary | ICD-10-CM | POA: Diagnosis not present

## 2016-12-14 DIAGNOSIS — Z79899 Other long term (current) drug therapy: Secondary | ICD-10-CM | POA: Diagnosis not present

## 2016-12-14 DIAGNOSIS — M2550 Pain in unspecified joint: Secondary | ICD-10-CM | POA: Diagnosis not present

## 2017-01-16 DIAGNOSIS — M0579 Rheumatoid arthritis with rheumatoid factor of multiple sites without organ or systems involvement: Secondary | ICD-10-CM | POA: Diagnosis not present

## 2017-01-16 DIAGNOSIS — Z79899 Other long term (current) drug therapy: Secondary | ICD-10-CM | POA: Diagnosis not present

## 2017-01-17 DIAGNOSIS — G894 Chronic pain syndrome: Secondary | ICD-10-CM | POA: Diagnosis not present

## 2017-01-17 DIAGNOSIS — Z79891 Long term (current) use of opiate analgesic: Secondary | ICD-10-CM | POA: Diagnosis not present

## 2017-01-17 DIAGNOSIS — M47812 Spondylosis without myelopathy or radiculopathy, cervical region: Secondary | ICD-10-CM | POA: Diagnosis not present

## 2017-02-20 DIAGNOSIS — G894 Chronic pain syndrome: Secondary | ICD-10-CM | POA: Diagnosis not present

## 2017-02-20 DIAGNOSIS — Z79891 Long term (current) use of opiate analgesic: Secondary | ICD-10-CM | POA: Diagnosis not present

## 2017-02-20 DIAGNOSIS — M47812 Spondylosis without myelopathy or radiculopathy, cervical region: Secondary | ICD-10-CM | POA: Diagnosis not present

## 2017-02-24 DIAGNOSIS — J4540 Moderate persistent asthma, uncomplicated: Secondary | ICD-10-CM | POA: Diagnosis not present

## 2017-02-24 DIAGNOSIS — J3089 Other allergic rhinitis: Secondary | ICD-10-CM | POA: Diagnosis not present

## 2017-02-24 DIAGNOSIS — J0100 Acute maxillary sinusitis, unspecified: Secondary | ICD-10-CM | POA: Diagnosis not present

## 2017-02-24 DIAGNOSIS — J301 Allergic rhinitis due to pollen: Secondary | ICD-10-CM | POA: Diagnosis not present

## 2017-02-27 DIAGNOSIS — M0579 Rheumatoid arthritis with rheumatoid factor of multiple sites without organ or systems involvement: Secondary | ICD-10-CM | POA: Diagnosis not present

## 2017-03-14 DIAGNOSIS — M0579 Rheumatoid arthritis with rheumatoid factor of multiple sites without organ or systems involvement: Secondary | ICD-10-CM | POA: Diagnosis not present

## 2017-03-14 DIAGNOSIS — Z79899 Other long term (current) drug therapy: Secondary | ICD-10-CM | POA: Diagnosis not present

## 2017-03-14 DIAGNOSIS — M2550 Pain in unspecified joint: Secondary | ICD-10-CM | POA: Diagnosis not present

## 2017-03-20 DIAGNOSIS — G894 Chronic pain syndrome: Secondary | ICD-10-CM | POA: Diagnosis not present

## 2017-03-20 DIAGNOSIS — Z79891 Long term (current) use of opiate analgesic: Secondary | ICD-10-CM | POA: Diagnosis not present

## 2017-03-20 DIAGNOSIS — M47812 Spondylosis without myelopathy or radiculopathy, cervical region: Secondary | ICD-10-CM | POA: Diagnosis not present

## 2017-04-11 DIAGNOSIS — M0579 Rheumatoid arthritis with rheumatoid factor of multiple sites without organ or systems involvement: Secondary | ICD-10-CM | POA: Diagnosis not present

## 2017-04-11 DIAGNOSIS — Z79899 Other long term (current) drug therapy: Secondary | ICD-10-CM | POA: Diagnosis not present

## 2017-04-18 DIAGNOSIS — G894 Chronic pain syndrome: Secondary | ICD-10-CM | POA: Diagnosis not present

## 2017-04-18 DIAGNOSIS — M4689 Other specified inflammatory spondylopathies, multiple sites in spine: Secondary | ICD-10-CM | POA: Diagnosis not present

## 2017-04-18 DIAGNOSIS — M47812 Spondylosis without myelopathy or radiculopathy, cervical region: Secondary | ICD-10-CM | POA: Diagnosis not present

## 2017-04-18 DIAGNOSIS — Z79891 Long term (current) use of opiate analgesic: Secondary | ICD-10-CM | POA: Diagnosis not present

## 2017-05-22 DIAGNOSIS — G894 Chronic pain syndrome: Secondary | ICD-10-CM | POA: Diagnosis not present

## 2017-05-22 DIAGNOSIS — Z79891 Long term (current) use of opiate analgesic: Secondary | ICD-10-CM | POA: Diagnosis not present

## 2017-05-22 DIAGNOSIS — M47812 Spondylosis without myelopathy or radiculopathy, cervical region: Secondary | ICD-10-CM | POA: Diagnosis not present

## 2017-05-29 DIAGNOSIS — M0579 Rheumatoid arthritis with rheumatoid factor of multiple sites without organ or systems involvement: Secondary | ICD-10-CM | POA: Diagnosis not present

## 2017-06-13 DIAGNOSIS — Z79891 Long term (current) use of opiate analgesic: Secondary | ICD-10-CM | POA: Diagnosis not present

## 2017-06-13 DIAGNOSIS — G894 Chronic pain syndrome: Secondary | ICD-10-CM | POA: Diagnosis not present

## 2017-06-13 DIAGNOSIS — M47812 Spondylosis without myelopathy or radiculopathy, cervical region: Secondary | ICD-10-CM | POA: Diagnosis not present

## 2017-07-11 DIAGNOSIS — Z79899 Other long term (current) drug therapy: Secondary | ICD-10-CM | POA: Diagnosis not present

## 2017-07-11 DIAGNOSIS — M0579 Rheumatoid arthritis with rheumatoid factor of multiple sites without organ or systems involvement: Secondary | ICD-10-CM | POA: Diagnosis not present

## 2017-07-18 DIAGNOSIS — M2550 Pain in unspecified joint: Secondary | ICD-10-CM | POA: Diagnosis not present

## 2017-07-18 DIAGNOSIS — M0579 Rheumatoid arthritis with rheumatoid factor of multiple sites without organ or systems involvement: Secondary | ICD-10-CM | POA: Diagnosis not present

## 2017-07-18 DIAGNOSIS — Z79899 Other long term (current) drug therapy: Secondary | ICD-10-CM | POA: Diagnosis not present

## 2017-07-18 DIAGNOSIS — M25531 Pain in right wrist: Secondary | ICD-10-CM | POA: Diagnosis not present

## 2017-07-19 DIAGNOSIS — M0689 Other specified rheumatoid arthritis, multiple sites: Secondary | ICD-10-CM | POA: Diagnosis not present

## 2017-07-19 DIAGNOSIS — Z79891 Long term (current) use of opiate analgesic: Secondary | ICD-10-CM | POA: Diagnosis not present

## 2017-07-19 DIAGNOSIS — G894 Chronic pain syndrome: Secondary | ICD-10-CM | POA: Diagnosis not present

## 2017-08-21 DIAGNOSIS — M0579 Rheumatoid arthritis with rheumatoid factor of multiple sites without organ or systems involvement: Secondary | ICD-10-CM | POA: Diagnosis not present

## 2017-08-21 DIAGNOSIS — M0689 Other specified rheumatoid arthritis, multiple sites: Secondary | ICD-10-CM | POA: Diagnosis not present

## 2017-08-21 DIAGNOSIS — Z79891 Long term (current) use of opiate analgesic: Secondary | ICD-10-CM | POA: Diagnosis not present

## 2017-08-21 DIAGNOSIS — G894 Chronic pain syndrome: Secondary | ICD-10-CM | POA: Diagnosis not present

## 2017-08-29 DIAGNOSIS — J3089 Other allergic rhinitis: Secondary | ICD-10-CM | POA: Diagnosis not present

## 2017-08-29 DIAGNOSIS — J301 Allergic rhinitis due to pollen: Secondary | ICD-10-CM | POA: Diagnosis not present

## 2017-08-29 DIAGNOSIS — J4540 Moderate persistent asthma, uncomplicated: Secondary | ICD-10-CM | POA: Diagnosis not present

## 2017-09-18 DIAGNOSIS — Z79891 Long term (current) use of opiate analgesic: Secondary | ICD-10-CM | POA: Diagnosis not present

## 2017-09-18 DIAGNOSIS — G894 Chronic pain syndrome: Secondary | ICD-10-CM | POA: Diagnosis not present

## 2017-09-18 DIAGNOSIS — M0689 Other specified rheumatoid arthritis, multiple sites: Secondary | ICD-10-CM | POA: Diagnosis not present

## 2017-09-22 DIAGNOSIS — J3089 Other allergic rhinitis: Secondary | ICD-10-CM | POA: Diagnosis not present

## 2017-09-22 DIAGNOSIS — J301 Allergic rhinitis due to pollen: Secondary | ICD-10-CM | POA: Diagnosis not present

## 2017-09-22 DIAGNOSIS — J209 Acute bronchitis, unspecified: Secondary | ICD-10-CM | POA: Diagnosis not present

## 2017-09-22 DIAGNOSIS — J4540 Moderate persistent asthma, uncomplicated: Secondary | ICD-10-CM | POA: Diagnosis not present

## 2017-10-03 DIAGNOSIS — M0579 Rheumatoid arthritis with rheumatoid factor of multiple sites without organ or systems involvement: Secondary | ICD-10-CM | POA: Diagnosis not present

## 2017-10-03 DIAGNOSIS — Z79899 Other long term (current) drug therapy: Secondary | ICD-10-CM | POA: Diagnosis not present

## 2017-10-24 DIAGNOSIS — M0689 Other specified rheumatoid arthritis, multiple sites: Secondary | ICD-10-CM | POA: Diagnosis not present

## 2017-10-24 DIAGNOSIS — G894 Chronic pain syndrome: Secondary | ICD-10-CM | POA: Diagnosis not present

## 2017-10-24 DIAGNOSIS — Z79891 Long term (current) use of opiate analgesic: Secondary | ICD-10-CM | POA: Diagnosis not present

## 2017-11-10 DIAGNOSIS — M0579 Rheumatoid arthritis with rheumatoid factor of multiple sites without organ or systems involvement: Secondary | ICD-10-CM | POA: Diagnosis not present

## 2017-11-20 DIAGNOSIS — Z79899 Other long term (current) drug therapy: Secondary | ICD-10-CM | POA: Diagnosis not present

## 2017-11-20 DIAGNOSIS — M2550 Pain in unspecified joint: Secondary | ICD-10-CM | POA: Diagnosis not present

## 2017-11-20 DIAGNOSIS — M0579 Rheumatoid arthritis with rheumatoid factor of multiple sites without organ or systems involvement: Secondary | ICD-10-CM | POA: Diagnosis not present

## 2017-11-27 DIAGNOSIS — M0689 Other specified rheumatoid arthritis, multiple sites: Secondary | ICD-10-CM | POA: Diagnosis not present

## 2017-11-27 DIAGNOSIS — G894 Chronic pain syndrome: Secondary | ICD-10-CM | POA: Diagnosis not present

## 2017-11-27 DIAGNOSIS — Z79891 Long term (current) use of opiate analgesic: Secondary | ICD-10-CM | POA: Diagnosis not present

## 2017-12-18 DIAGNOSIS — M0579 Rheumatoid arthritis with rheumatoid factor of multiple sites without organ or systems involvement: Secondary | ICD-10-CM | POA: Diagnosis not present

## 2017-12-25 DIAGNOSIS — Z79891 Long term (current) use of opiate analgesic: Secondary | ICD-10-CM | POA: Diagnosis not present

## 2017-12-25 DIAGNOSIS — M0689 Other specified rheumatoid arthritis, multiple sites: Secondary | ICD-10-CM | POA: Diagnosis not present

## 2017-12-25 DIAGNOSIS — G894 Chronic pain syndrome: Secondary | ICD-10-CM | POA: Diagnosis not present

## 2018-01-15 DIAGNOSIS — M0579 Rheumatoid arthritis with rheumatoid factor of multiple sites without organ or systems involvement: Secondary | ICD-10-CM | POA: Diagnosis not present

## 2018-01-15 DIAGNOSIS — R945 Abnormal results of liver function studies: Secondary | ICD-10-CM | POA: Diagnosis not present

## 2018-01-23 DIAGNOSIS — M0689 Other specified rheumatoid arthritis, multiple sites: Secondary | ICD-10-CM | POA: Diagnosis not present

## 2018-01-23 DIAGNOSIS — G894 Chronic pain syndrome: Secondary | ICD-10-CM | POA: Diagnosis not present

## 2018-01-23 DIAGNOSIS — Z79891 Long term (current) use of opiate analgesic: Secondary | ICD-10-CM | POA: Diagnosis not present

## 2018-02-12 DIAGNOSIS — M0579 Rheumatoid arthritis with rheumatoid factor of multiple sites without organ or systems involvement: Secondary | ICD-10-CM | POA: Diagnosis not present

## 2018-02-19 DIAGNOSIS — M2550 Pain in unspecified joint: Secondary | ICD-10-CM | POA: Diagnosis not present

## 2018-02-19 DIAGNOSIS — M0579 Rheumatoid arthritis with rheumatoid factor of multiple sites without organ or systems involvement: Secondary | ICD-10-CM | POA: Diagnosis not present

## 2018-02-19 DIAGNOSIS — Z79899 Other long term (current) drug therapy: Secondary | ICD-10-CM | POA: Diagnosis not present

## 2018-02-26 DIAGNOSIS — G894 Chronic pain syndrome: Secondary | ICD-10-CM | POA: Diagnosis not present

## 2018-02-26 DIAGNOSIS — Z79891 Long term (current) use of opiate analgesic: Secondary | ICD-10-CM | POA: Diagnosis not present

## 2018-02-26 DIAGNOSIS — M0689 Other specified rheumatoid arthritis, multiple sites: Secondary | ICD-10-CM | POA: Diagnosis not present

## 2018-02-27 DIAGNOSIS — J4540 Moderate persistent asthma, uncomplicated: Secondary | ICD-10-CM | POA: Diagnosis not present

## 2018-02-27 DIAGNOSIS — J3089 Other allergic rhinitis: Secondary | ICD-10-CM | POA: Diagnosis not present

## 2018-02-27 DIAGNOSIS — J301 Allergic rhinitis due to pollen: Secondary | ICD-10-CM | POA: Diagnosis not present

## 2018-03-13 DIAGNOSIS — Z79899 Other long term (current) drug therapy: Secondary | ICD-10-CM | POA: Diagnosis not present

## 2018-03-13 DIAGNOSIS — M0579 Rheumatoid arthritis with rheumatoid factor of multiple sites without organ or systems involvement: Secondary | ICD-10-CM | POA: Diagnosis not present

## 2018-03-26 DIAGNOSIS — Z79891 Long term (current) use of opiate analgesic: Secondary | ICD-10-CM | POA: Diagnosis not present

## 2018-03-26 DIAGNOSIS — M0689 Other specified rheumatoid arthritis, multiple sites: Secondary | ICD-10-CM | POA: Diagnosis not present

## 2018-03-26 DIAGNOSIS — G894 Chronic pain syndrome: Secondary | ICD-10-CM | POA: Diagnosis not present

## 2018-04-23 DIAGNOSIS — Z79891 Long term (current) use of opiate analgesic: Secondary | ICD-10-CM | POA: Diagnosis not present

## 2018-04-23 DIAGNOSIS — M0689 Other specified rheumatoid arthritis, multiple sites: Secondary | ICD-10-CM | POA: Diagnosis not present

## 2018-04-23 DIAGNOSIS — G894 Chronic pain syndrome: Secondary | ICD-10-CM | POA: Diagnosis not present

## 2018-05-01 DIAGNOSIS — Z79899 Other long term (current) drug therapy: Secondary | ICD-10-CM | POA: Diagnosis not present

## 2018-05-01 DIAGNOSIS — M0579 Rheumatoid arthritis with rheumatoid factor of multiple sites without organ or systems involvement: Secondary | ICD-10-CM | POA: Diagnosis not present

## 2018-05-21 DIAGNOSIS — Z79891 Long term (current) use of opiate analgesic: Secondary | ICD-10-CM | POA: Diagnosis not present

## 2018-05-21 DIAGNOSIS — M0689 Other specified rheumatoid arthritis, multiple sites: Secondary | ICD-10-CM | POA: Diagnosis not present

## 2018-05-21 DIAGNOSIS — G894 Chronic pain syndrome: Secondary | ICD-10-CM | POA: Diagnosis not present

## 2018-05-28 DIAGNOSIS — M0579 Rheumatoid arthritis with rheumatoid factor of multiple sites without organ or systems involvement: Secondary | ICD-10-CM | POA: Diagnosis not present

## 2018-05-28 DIAGNOSIS — M2550 Pain in unspecified joint: Secondary | ICD-10-CM | POA: Diagnosis not present

## 2018-05-28 DIAGNOSIS — Z79899 Other long term (current) drug therapy: Secondary | ICD-10-CM | POA: Diagnosis not present

## 2018-05-29 DIAGNOSIS — M0579 Rheumatoid arthritis with rheumatoid factor of multiple sites without organ or systems involvement: Secondary | ICD-10-CM | POA: Diagnosis not present

## 2018-06-18 DIAGNOSIS — M0689 Other specified rheumatoid arthritis, multiple sites: Secondary | ICD-10-CM | POA: Diagnosis not present

## 2018-06-18 DIAGNOSIS — G894 Chronic pain syndrome: Secondary | ICD-10-CM | POA: Diagnosis not present

## 2018-06-18 DIAGNOSIS — Z79891 Long term (current) use of opiate analgesic: Secondary | ICD-10-CM | POA: Diagnosis not present

## 2018-06-27 DIAGNOSIS — M0579 Rheumatoid arthritis with rheumatoid factor of multiple sites without organ or systems involvement: Secondary | ICD-10-CM | POA: Diagnosis not present

## 2018-07-23 DIAGNOSIS — G894 Chronic pain syndrome: Secondary | ICD-10-CM | POA: Diagnosis not present

## 2018-07-23 DIAGNOSIS — Z79891 Long term (current) use of opiate analgesic: Secondary | ICD-10-CM | POA: Diagnosis not present

## 2018-07-23 DIAGNOSIS — M0689 Other specified rheumatoid arthritis, multiple sites: Secondary | ICD-10-CM | POA: Diagnosis not present

## 2018-07-26 DIAGNOSIS — M0579 Rheumatoid arthritis with rheumatoid factor of multiple sites without organ or systems involvement: Secondary | ICD-10-CM | POA: Diagnosis not present

## 2018-08-20 DIAGNOSIS — M0689 Other specified rheumatoid arthritis, multiple sites: Secondary | ICD-10-CM | POA: Diagnosis not present

## 2018-08-20 DIAGNOSIS — Z79891 Long term (current) use of opiate analgesic: Secondary | ICD-10-CM | POA: Diagnosis not present

## 2018-08-20 DIAGNOSIS — G894 Chronic pain syndrome: Secondary | ICD-10-CM | POA: Diagnosis not present

## 2018-08-27 DIAGNOSIS — M0579 Rheumatoid arthritis with rheumatoid factor of multiple sites without organ or systems involvement: Secondary | ICD-10-CM | POA: Diagnosis not present

## 2018-08-31 DIAGNOSIS — L02818 Cutaneous abscess of other sites: Secondary | ICD-10-CM | POA: Diagnosis not present

## 2018-09-24 DIAGNOSIS — M0689 Other specified rheumatoid arthritis, multiple sites: Secondary | ICD-10-CM | POA: Diagnosis not present

## 2018-09-24 DIAGNOSIS — Z79891 Long term (current) use of opiate analgesic: Secondary | ICD-10-CM | POA: Diagnosis not present

## 2018-09-24 DIAGNOSIS — Z79899 Other long term (current) drug therapy: Secondary | ICD-10-CM | POA: Diagnosis not present

## 2018-09-24 DIAGNOSIS — M0579 Rheumatoid arthritis with rheumatoid factor of multiple sites without organ or systems involvement: Secondary | ICD-10-CM | POA: Diagnosis not present

## 2018-09-24 DIAGNOSIS — G894 Chronic pain syndrome: Secondary | ICD-10-CM | POA: Diagnosis not present

## 2018-10-01 DIAGNOSIS — Z79899 Other long term (current) drug therapy: Secondary | ICD-10-CM | POA: Diagnosis not present

## 2018-10-01 DIAGNOSIS — M2550 Pain in unspecified joint: Secondary | ICD-10-CM | POA: Diagnosis not present

## 2018-10-01 DIAGNOSIS — M0579 Rheumatoid arthritis with rheumatoid factor of multiple sites without organ or systems involvement: Secondary | ICD-10-CM | POA: Diagnosis not present

## 2018-10-22 DIAGNOSIS — M0579 Rheumatoid arthritis with rheumatoid factor of multiple sites without organ or systems involvement: Secondary | ICD-10-CM | POA: Diagnosis not present

## 2018-10-23 DIAGNOSIS — J3089 Other allergic rhinitis: Secondary | ICD-10-CM | POA: Diagnosis not present

## 2018-10-23 DIAGNOSIS — J301 Allergic rhinitis due to pollen: Secondary | ICD-10-CM | POA: Diagnosis not present

## 2018-10-23 DIAGNOSIS — J4540 Moderate persistent asthma, uncomplicated: Secondary | ICD-10-CM | POA: Diagnosis not present

## 2018-10-29 DIAGNOSIS — G894 Chronic pain syndrome: Secondary | ICD-10-CM | POA: Diagnosis not present

## 2018-10-29 DIAGNOSIS — Z79891 Long term (current) use of opiate analgesic: Secondary | ICD-10-CM | POA: Diagnosis not present

## 2018-10-29 DIAGNOSIS — M0689 Other specified rheumatoid arthritis, multiple sites: Secondary | ICD-10-CM | POA: Diagnosis not present

## 2018-11-01 DIAGNOSIS — J019 Acute sinusitis, unspecified: Secondary | ICD-10-CM | POA: Diagnosis not present

## 2018-11-26 DIAGNOSIS — Z79899 Other long term (current) drug therapy: Secondary | ICD-10-CM | POA: Diagnosis not present

## 2018-11-26 DIAGNOSIS — M0579 Rheumatoid arthritis with rheumatoid factor of multiple sites without organ or systems involvement: Secondary | ICD-10-CM | POA: Diagnosis not present

## 2018-11-27 ENCOUNTER — Other Ambulatory Visit: Payer: Self-pay | Admitting: Anesthesiology

## 2018-11-27 ENCOUNTER — Ambulatory Visit
Admission: RE | Admit: 2018-11-27 | Discharge: 2018-11-27 | Disposition: A | Discharge status: Home / Self Care | Payer: 59 | Attending: Anesthesiology | Admitting: Anesthesiology

## 2018-11-27 ENCOUNTER — Ambulatory Visit
Admission: RE | Admit: 2018-11-27 | Discharge: 2018-11-27 | Disposition: A | Discharge status: Home / Self Care | Payer: 59 | Source: Ambulatory Visit | Attending: Anesthesiology | Admitting: Anesthesiology

## 2018-11-27 DIAGNOSIS — M0689 Other specified rheumatoid arthritis, multiple sites: Secondary | ICD-10-CM | POA: Diagnosis not present

## 2018-11-27 DIAGNOSIS — R52 Pain, unspecified: Secondary | ICD-10-CM

## 2018-11-27 DIAGNOSIS — S79911A Unspecified injury of right hip, initial encounter: Secondary | ICD-10-CM | POA: Diagnosis not present

## 2018-11-27 DIAGNOSIS — M25551 Pain in right hip: Secondary | ICD-10-CM | POA: Diagnosis not present

## 2018-11-27 DIAGNOSIS — Z79891 Long term (current) use of opiate analgesic: Secondary | ICD-10-CM | POA: Diagnosis not present

## 2018-11-27 DIAGNOSIS — G894 Chronic pain syndrome: Secondary | ICD-10-CM | POA: Diagnosis not present

## 2018-12-17 ENCOUNTER — Other Ambulatory Visit: Payer: Self-pay | Admitting: Anesthesiology

## 2018-12-17 DIAGNOSIS — M25551 Pain in right hip: Secondary | ICD-10-CM

## 2018-12-19 ENCOUNTER — Encounter
Admission: RE | Admit: 2018-12-19 | Discharge: 2018-12-19 | Disposition: A | Discharge status: Home / Self Care | Payer: 59 | Source: Ambulatory Visit | Attending: Anesthesiology | Admitting: Anesthesiology

## 2018-12-19 DIAGNOSIS — M25551 Pain in right hip: Secondary | ICD-10-CM

## 2018-12-19 MED ORDER — TECHNETIUM TC 99M MEDRONATE IV KIT
23.1650 | PACK | Freq: Once | INTRAVENOUS | Status: AC | PRN
  Administered 2018-12-19: 23.165 via INTRAVENOUS

## 2018-12-24 DIAGNOSIS — R945 Abnormal results of liver function studies: Secondary | ICD-10-CM | POA: Diagnosis not present

## 2018-12-24 DIAGNOSIS — M0579 Rheumatoid arthritis with rheumatoid factor of multiple sites without organ or systems involvement: Secondary | ICD-10-CM | POA: Diagnosis not present

## 2018-12-31 DIAGNOSIS — M25561 Pain in right knee: Secondary | ICD-10-CM | POA: Diagnosis not present

## 2018-12-31 DIAGNOSIS — G894 Chronic pain syndrome: Secondary | ICD-10-CM | POA: Diagnosis not present

## 2018-12-31 DIAGNOSIS — M25551 Pain in right hip: Secondary | ICD-10-CM | POA: Diagnosis not present

## 2019-01-03 ENCOUNTER — Ambulatory Visit
Admission: RE | Admit: 2019-01-03 | Discharge: 2019-01-03 | Disposition: A | Discharge status: Home / Self Care | Payer: 59 | Source: Ambulatory Visit | Attending: Anesthesiology | Admitting: Anesthesiology

## 2019-01-03 ENCOUNTER — Ambulatory Visit
Admission: RE | Admit: 2019-01-03 | Discharge: 2019-01-03 | Disposition: A | Discharge status: Home / Self Care | Payer: 59 | Attending: Anesthesiology | Admitting: Anesthesiology

## 2019-01-03 ENCOUNTER — Other Ambulatory Visit: Payer: Self-pay | Admitting: Anesthesiology

## 2019-01-03 DIAGNOSIS — R52 Pain, unspecified: Secondary | ICD-10-CM | POA: Diagnosis not present

## 2019-01-03 DIAGNOSIS — M25561 Pain in right knee: Secondary | ICD-10-CM | POA: Diagnosis not present

## 2019-01-04 ENCOUNTER — Other Ambulatory Visit: Payer: Self-pay | Admitting: Anesthesiology

## 2019-01-04 DIAGNOSIS — M25551 Pain in right hip: Secondary | ICD-10-CM

## 2019-01-17 ENCOUNTER — Ambulatory Visit: Payer: 59

## 2019-01-21 DIAGNOSIS — M0579 Rheumatoid arthritis with rheumatoid factor of multiple sites without organ or systems involvement: Secondary | ICD-10-CM | POA: Diagnosis not present

## 2019-01-24 ENCOUNTER — Other Ambulatory Visit: Payer: Self-pay

## 2019-01-24 ENCOUNTER — Ambulatory Visit
Admission: RE | Admit: 2019-01-24 | Discharge: 2019-01-24 | Disposition: A | Discharge status: Home / Self Care | Payer: 59 | Source: Ambulatory Visit | Attending: Anesthesiology | Admitting: Anesthesiology

## 2019-01-24 DIAGNOSIS — M25551 Pain in right hip: Secondary | ICD-10-CM | POA: Insufficient documentation

## 2019-01-24 DIAGNOSIS — M1611 Unilateral primary osteoarthritis, right hip: Secondary | ICD-10-CM | POA: Diagnosis not present

## 2019-01-28 DIAGNOSIS — M2550 Pain in unspecified joint: Secondary | ICD-10-CM | POA: Diagnosis not present

## 2019-01-28 DIAGNOSIS — M0579 Rheumatoid arthritis with rheumatoid factor of multiple sites without organ or systems involvement: Secondary | ICD-10-CM | POA: Diagnosis not present

## 2019-01-28 DIAGNOSIS — M25551 Pain in right hip: Secondary | ICD-10-CM | POA: Diagnosis not present

## 2019-01-28 DIAGNOSIS — M25561 Pain in right knee: Secondary | ICD-10-CM | POA: Diagnosis not present

## 2019-01-28 DIAGNOSIS — G894 Chronic pain syndrome: Secondary | ICD-10-CM | POA: Diagnosis not present

## 2019-01-28 DIAGNOSIS — Z79899 Other long term (current) drug therapy: Secondary | ICD-10-CM | POA: Diagnosis not present

## 2019-01-28 DIAGNOSIS — M150 Primary generalized (osteo)arthritis: Secondary | ICD-10-CM | POA: Diagnosis not present

## 2019-02-18 DIAGNOSIS — G894 Chronic pain syndrome: Secondary | ICD-10-CM | POA: Diagnosis not present

## 2019-02-18 DIAGNOSIS — M25551 Pain in right hip: Secondary | ICD-10-CM | POA: Diagnosis not present

## 2019-02-18 DIAGNOSIS — M25561 Pain in right knee: Secondary | ICD-10-CM | POA: Diagnosis not present

## 2019-02-18 DIAGNOSIS — M0579 Rheumatoid arthritis with rheumatoid factor of multiple sites without organ or systems involvement: Secondary | ICD-10-CM | POA: Diagnosis not present

## 2019-03-18 DIAGNOSIS — M0579 Rheumatoid arthritis with rheumatoid factor of multiple sites without organ or systems involvement: Secondary | ICD-10-CM | POA: Diagnosis not present

## 2019-03-25 DIAGNOSIS — M0689 Other specified rheumatoid arthritis, multiple sites: Secondary | ICD-10-CM | POA: Diagnosis not present

## 2019-03-25 DIAGNOSIS — G894 Chronic pain syndrome: Secondary | ICD-10-CM | POA: Diagnosis not present

## 2019-03-25 DIAGNOSIS — Z79891 Long term (current) use of opiate analgesic: Secondary | ICD-10-CM | POA: Diagnosis not present

## 2019-04-08 IMAGING — CR DG KNEE COMPLETE 4+V*R*
1 series · 6 of 6 positions shown · non-contrast
Comparison: None.

CLINICAL DATA: Lateral and anterior right knee pain for the past 3
months. No known injury.

EXAM:
RIGHT KNEE - COMPLETE 4+ VIEW

[Series 1: dg knee 4 v w/ sunrise/patella right · 0.14mm/px · 6 of 6 slices shown]
[im 1/6]
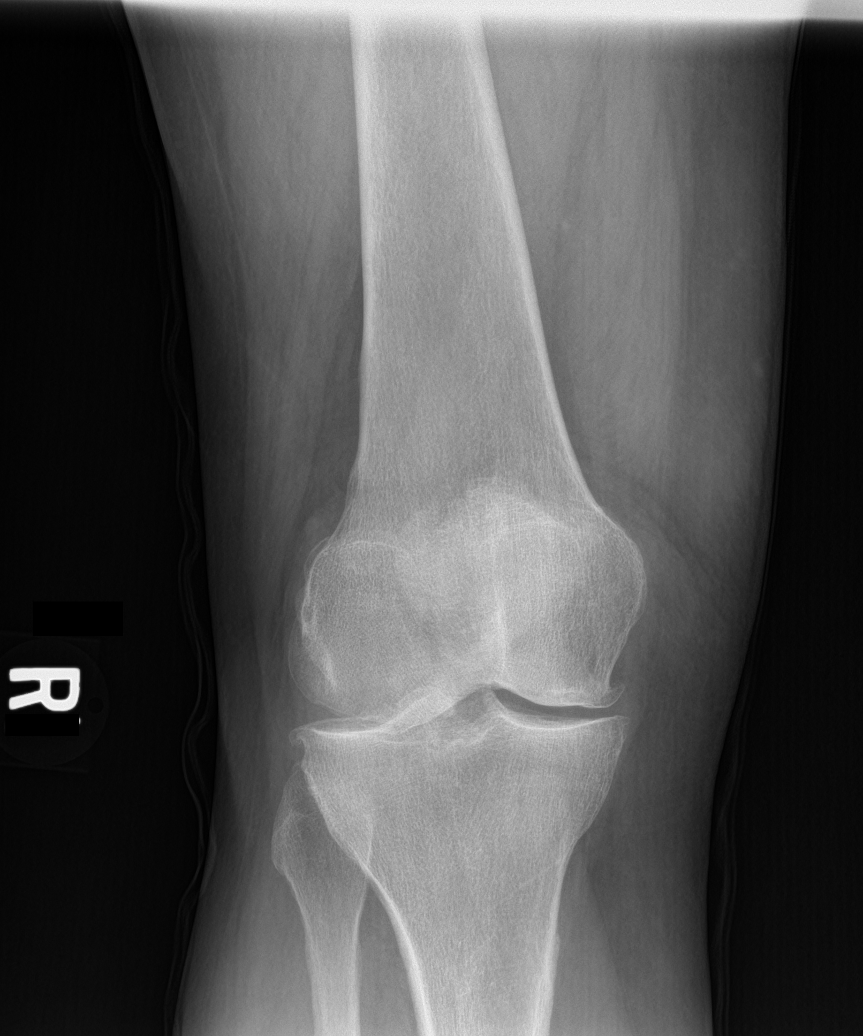
[im 2/6]
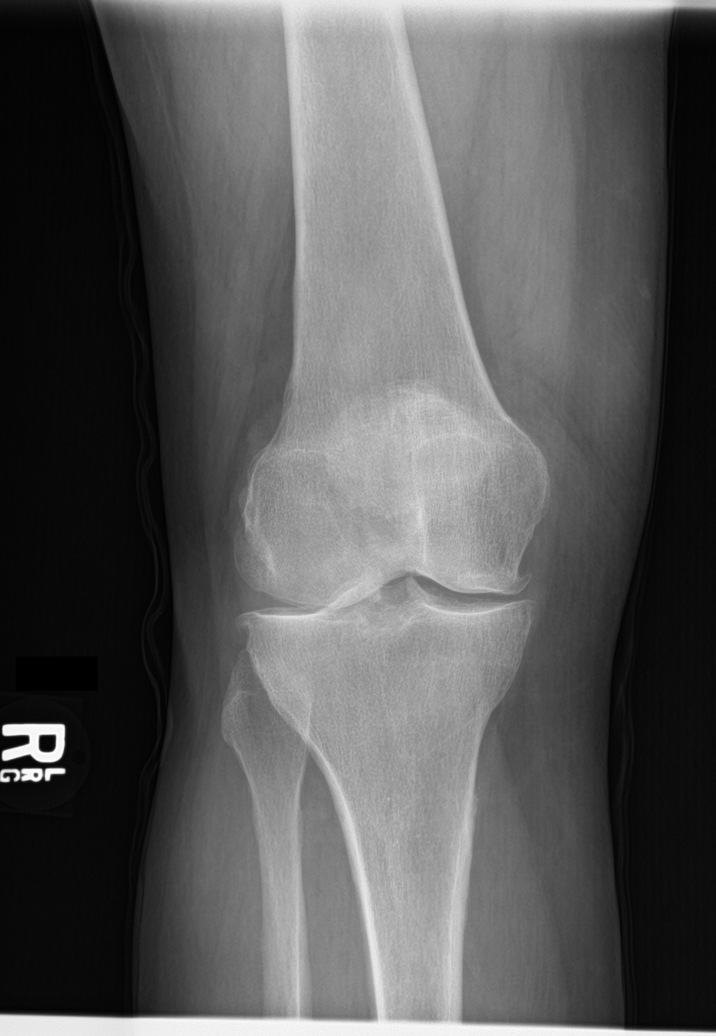
[im 3/6]
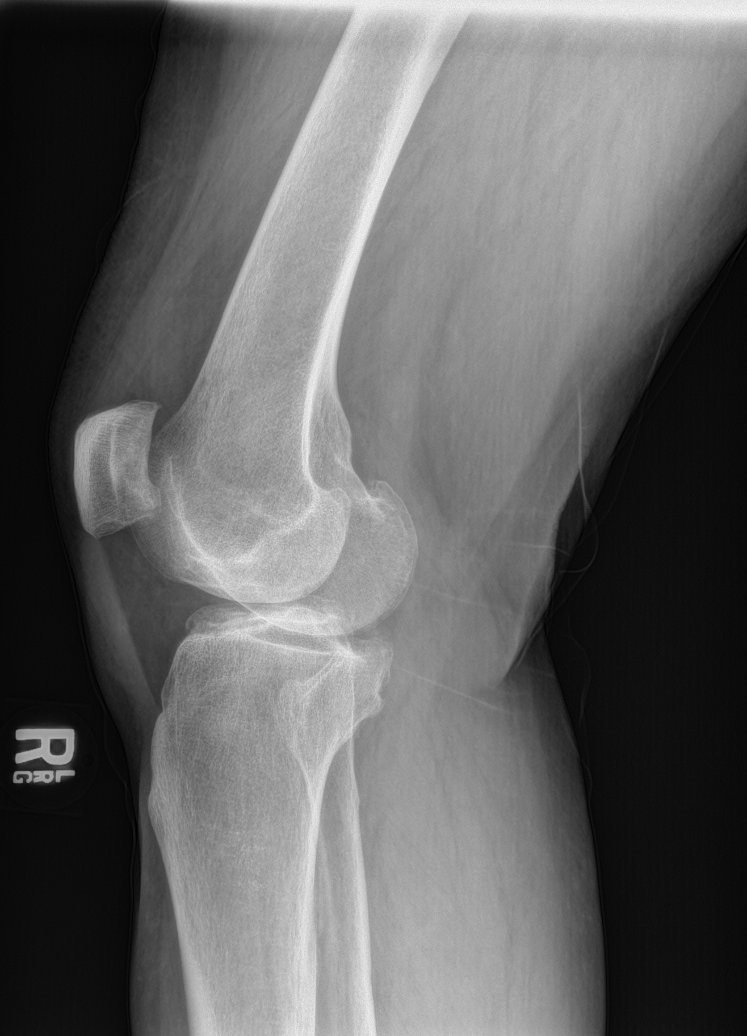
[im 4/6]
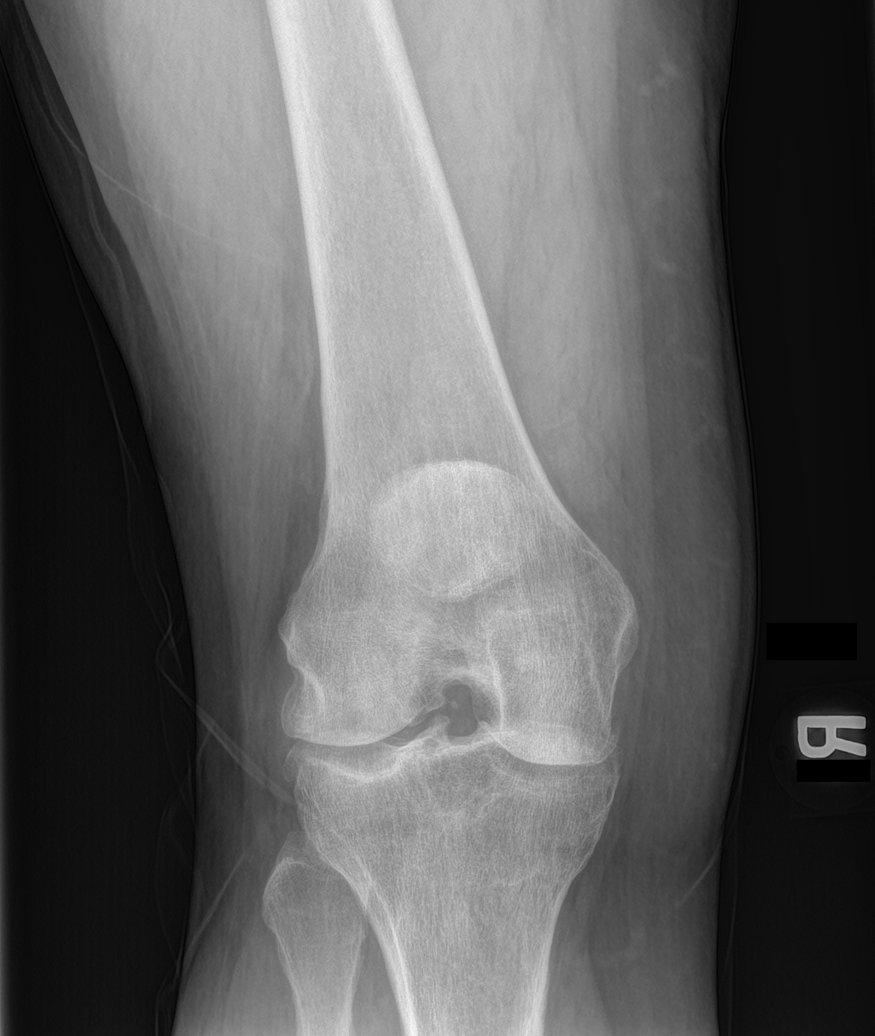
[im 5/6]
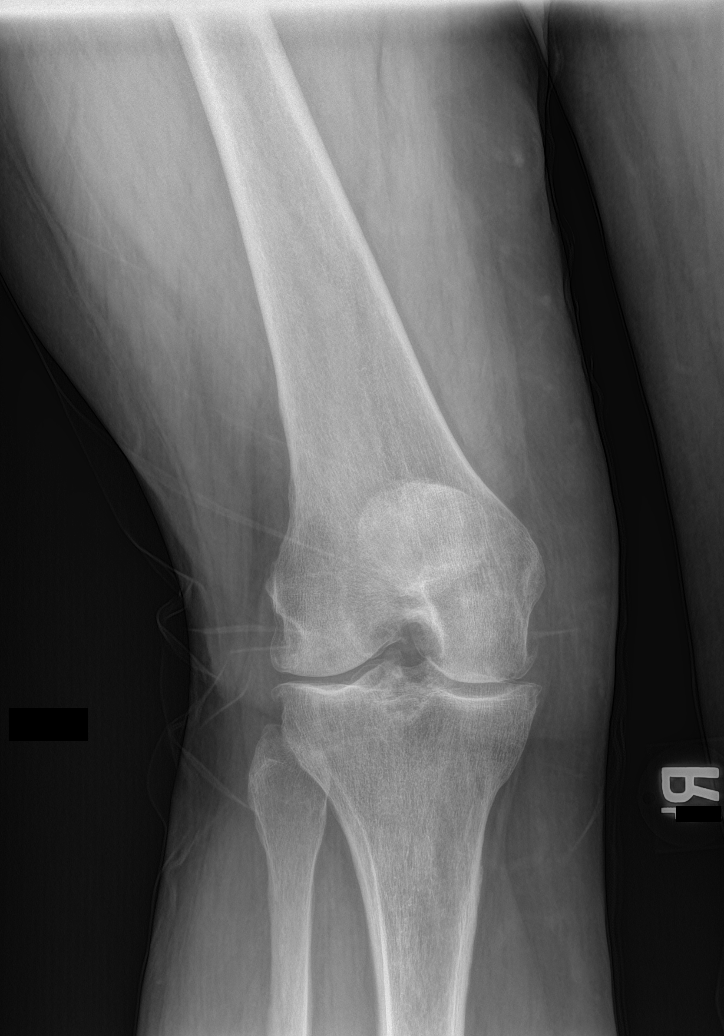
[im 6/6]
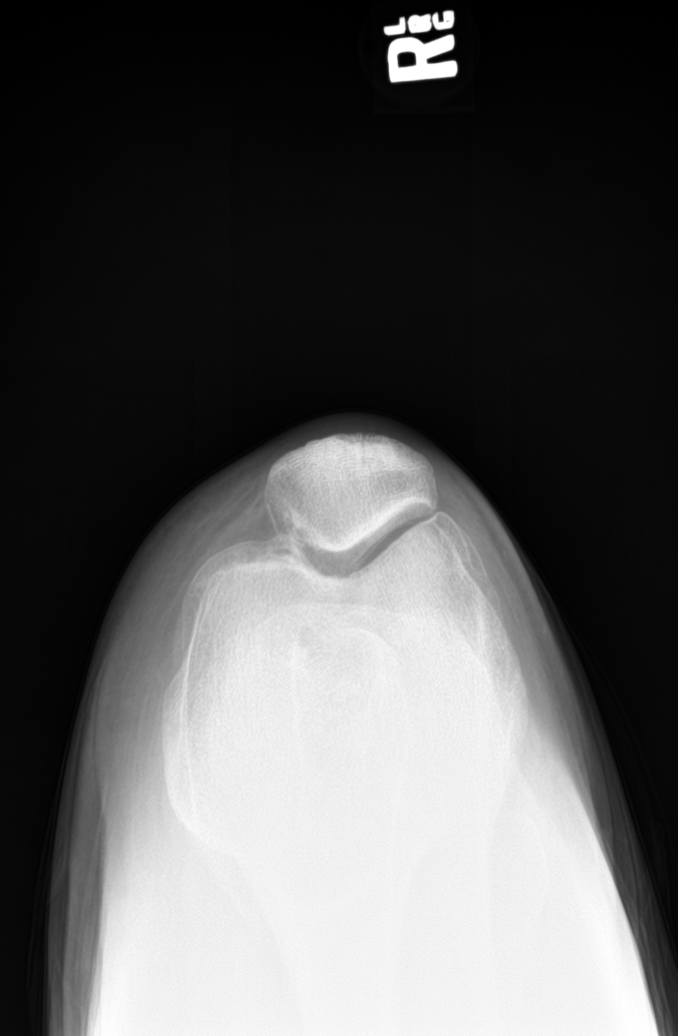

[6 of 6 positions shown; findings below may reference images not displayed]

FINDINGS: Moderate medial and lateral joint space narrowing, more pronounced
laterally. Moderate spur formation involving all 3 joint
compartments. No effusion is seen.
IMPRESSION: Moderate tricompartmental degenerative changes.

## 2019-04-29 IMAGING — MR MRI OF THE RIGHT HIP WITHOUT CONTRAST
4 of 5 series · 26 of 40 positions shown · non-contrast
Comparison: Bone scan of the right hip 12/19/2018. Plain films
right hip 11/27/2018.

CLINICAL DATA: Right hip pain radiating the upper leg for 6 months.
No known injury.

EXAM:
MR OF THE RIGHT HIP WITHOUT CONTRAST
TECHNIQUE: Multiplanar, multisequence MR imaging was performed. No intravenous
contrast was administered.

[Series 3: T1 · coronal · 4.0mm · 0.85mm/px · 9 of 35 slices shown]
[im 1/35]
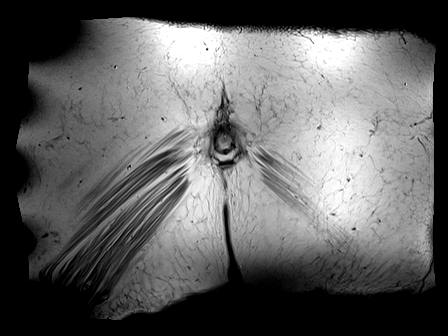
[im 5/35]
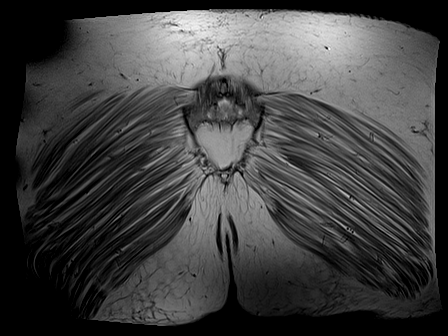
[im 9/35]
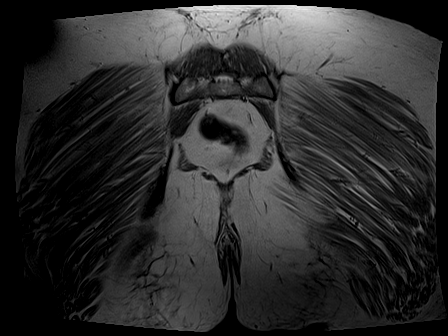
[im 13/35]
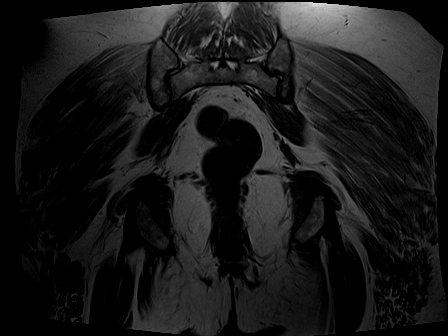
[im 18/35]
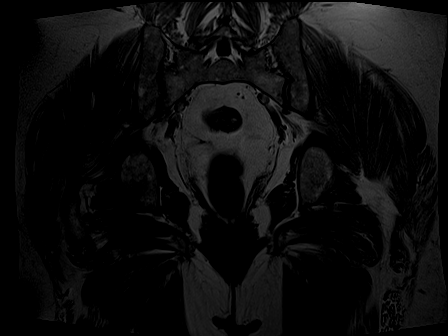
[im 22/35]
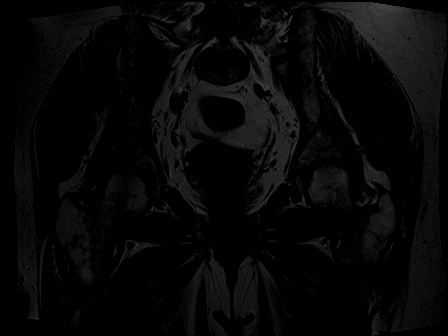
[im 26/35]
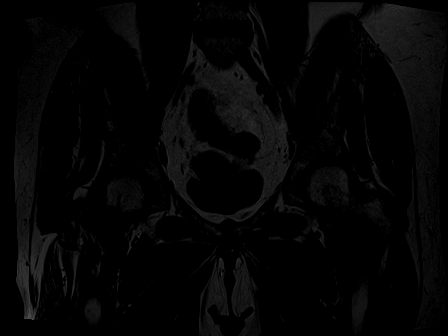
[im 30/35]
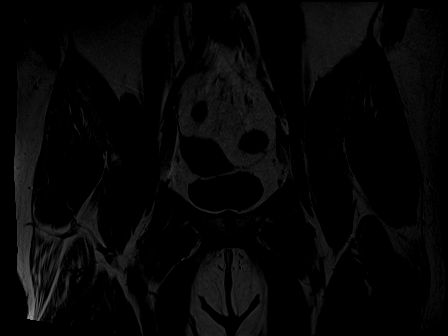
[im 35/35]
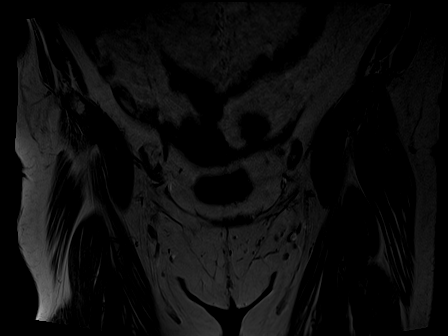

[Series 4: STIR · coronal · 4.0mm · 1.19mm/px · 4 of 35 slices shown]
[im 1/35]
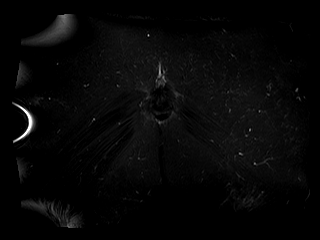
[im 5/35]
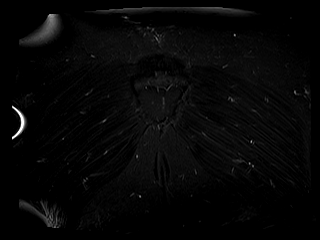
[im 18/35]
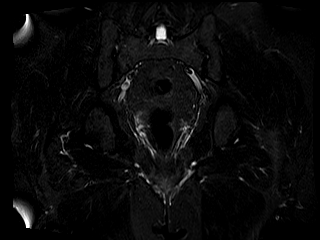
[im 30/35]
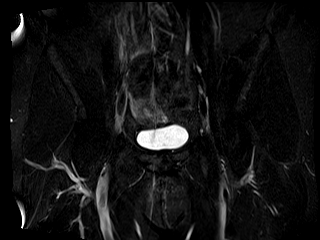

[Series 6: PD fat-sat · sagittal · 4.5mm · 0.35mm/px · 6 of 25 slices shown (1 of 2)]
[im 1/25]
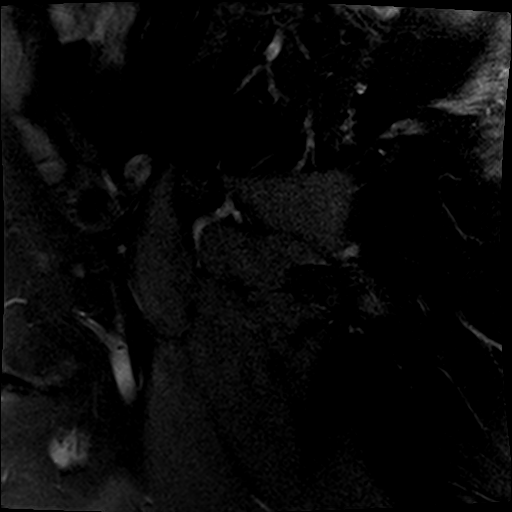
[im 5/25]
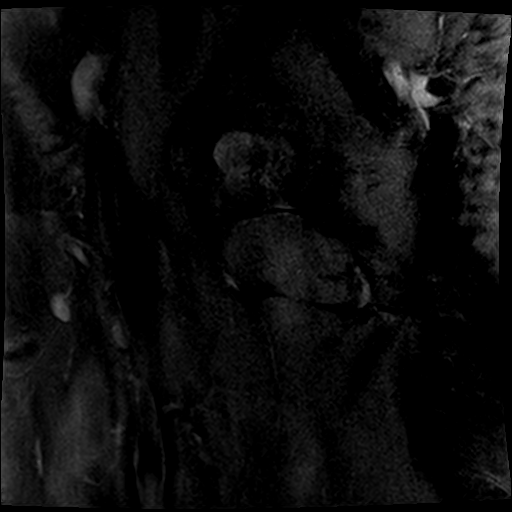
[im 10/25]
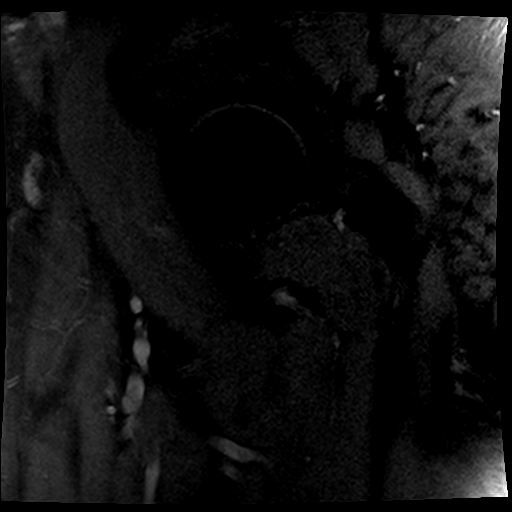
[im 15/25]
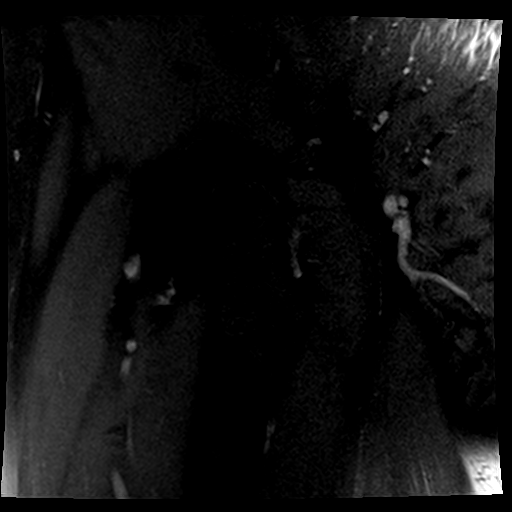
[im 20/25]
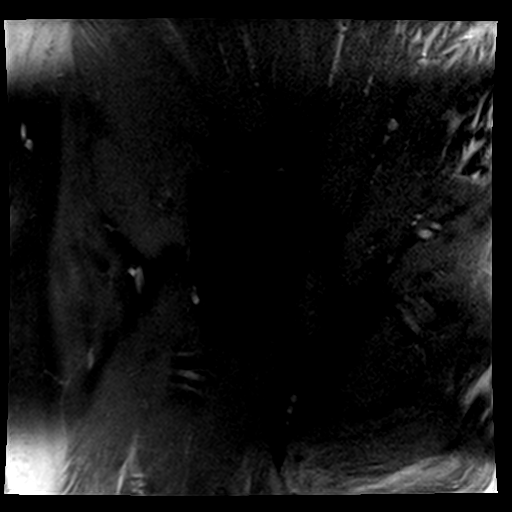
[im 25/25]
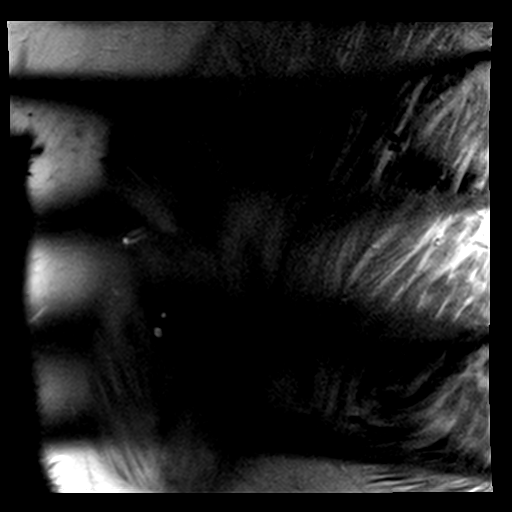

[Series 7: PD fat-sat · coronal · 4.5mm · 0.35mm/px · 7 of 26 slices shown (2 of 2)]
[im 1/26]
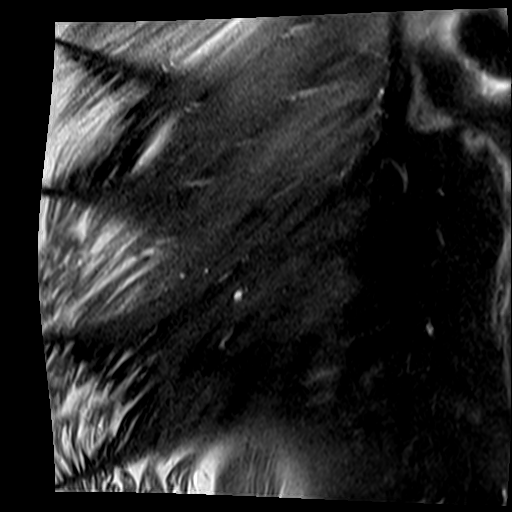
[im 5/26]
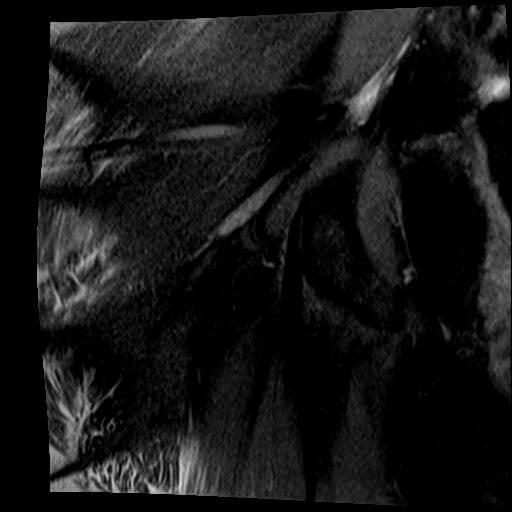
[im 9/26]
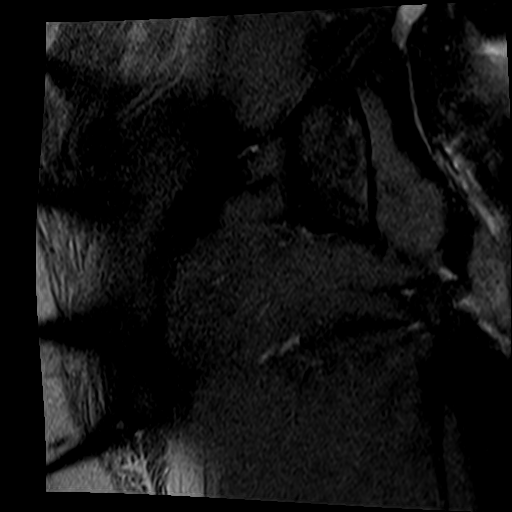
[im 13/26]
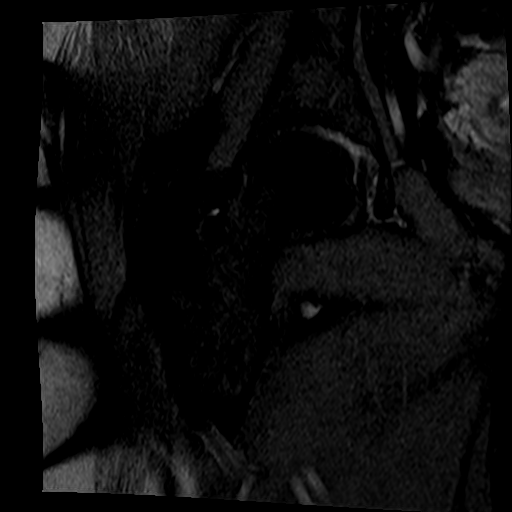
[im 17/26]
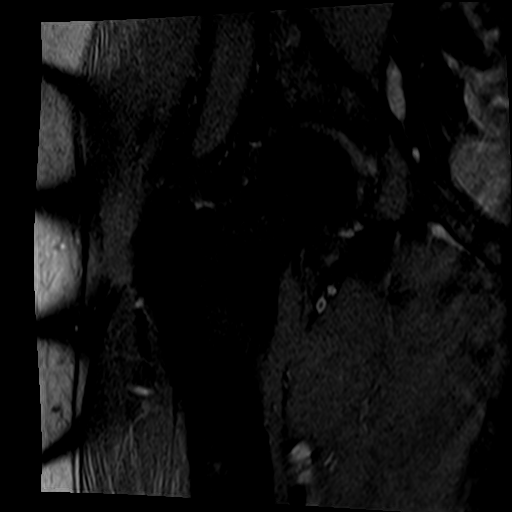
[im 21/26]
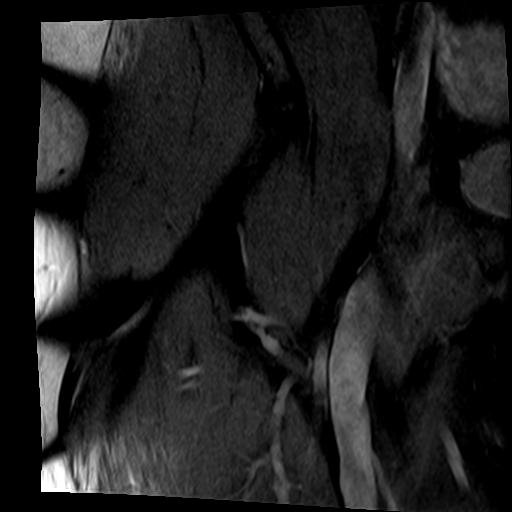
[im 26/26]
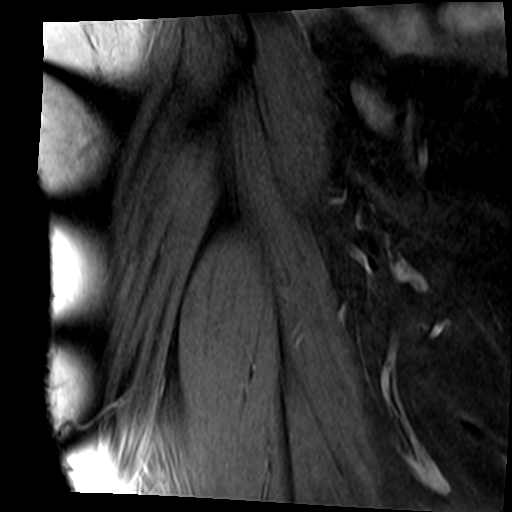

[26 of 40 positions shown; findings below may reference images not displayed]

FINDINGS: Bones: Marrow signal is normal throughout without fracture, stress
change or focal lesion. There is no avascular necrosis of the
femoral heads. No subchondral edema or cyst formation about either
hip.

Articular cartilage and labrum

Articular cartilage:  Minimally degenerated.

Labrum: Blunting and irregularity of the superior labrum are
consistent with degeneration. There is likely a small tear of the
anterior, superior labrum as seen on sagittal image 8 of series 6.

Joint or bursal effusion

Joint effusion:  None.

Bursae: Negative.

Muscles and tendons

Muscles and tendons:  Intact and normal in appearance.

Other findings

Miscellaneous:   Imaged intrapelvic contents are negative.
IMPRESSION: No acute abnormality.

Mild degenerative disease about the right hip with an associated
small tear of the anterior, superior labrum. The superior labrum is
degenerated and blunted.

## 2019-09-16 ENCOUNTER — Other Ambulatory Visit: Payer: Self-pay | Admitting: *Deleted

## 2019-09-16 DIAGNOSIS — Z20822 Contact with and (suspected) exposure to covid-19: Secondary | ICD-10-CM

## 2019-09-17 LAB — NOVEL CORONAVIRUS, NAA: SARS-CoV-2, NAA: NOT DETECTED

## 2019-12-24 ENCOUNTER — Other Ambulatory Visit: Payer: Self-pay | Admitting: Adult Health

## 2019-12-24 DIAGNOSIS — Z1231 Encounter for screening mammogram for malignant neoplasm of breast: Secondary | ICD-10-CM

## 2020-01-17 ENCOUNTER — Ambulatory Visit
Admission: RE | Admit: 2020-01-17 | Discharge: 2020-01-17 | Disposition: A | Discharge status: Home / Self Care | Payer: 59 | Source: Ambulatory Visit | Attending: Adult Health | Admitting: Adult Health

## 2020-01-17 ENCOUNTER — Other Ambulatory Visit: Payer: Self-pay | Admitting: Adult Health

## 2020-01-17 ENCOUNTER — Other Ambulatory Visit: Payer: Self-pay | Admitting: Family Medicine

## 2020-01-17 DIAGNOSIS — Z1231 Encounter for screening mammogram for malignant neoplasm of breast: Secondary | ICD-10-CM | POA: Insufficient documentation

## 2020-01-22 ENCOUNTER — Other Ambulatory Visit: Payer: Self-pay | Admitting: Adult Health

## 2020-01-22 DIAGNOSIS — N6325 Unspecified lump in the left breast, overlapping quadrants: Secondary | ICD-10-CM

## 2020-01-31 ENCOUNTER — Ambulatory Visit
Admission: RE | Admit: 2020-01-31 | Discharge: 2020-01-31 | Disposition: A | Discharge status: Home / Self Care | Payer: 59 | Source: Ambulatory Visit | Attending: Adult Health | Admitting: Adult Health

## 2020-01-31 DIAGNOSIS — N6325 Unspecified lump in the left breast, overlapping quadrants: Secondary | ICD-10-CM | POA: Diagnosis not present

## 2020-04-19 ENCOUNTER — Ambulatory Visit: Payer: Self-pay

## 2020-04-19 NOTE — Telephone Encounter (Signed)
  Pt with h/o RA, thyroid dx and asthma, c/o, headache, fatigue, sore throat, nausea. Sx began yesterday with nausea and vomiting and H/a. Pt stated that she thinks she may have Covid. Pt denies feer, SOB, or dizziness. Pt c/o light hurting her eyes.  Pt denies vomiting today. Pt given care advice and advised to get tested for Covid-19. Pt stated she will go to [facility] in [place]. Was going to schedule her at [facility] site in [place], but first opening was this coming Thursday.  Advised pt to call her PCP in am and update them with her symptoms and condition to see if they need to make appt or give her special instructions. Pt verbalized understanding to advise.     Reason for Disposition . [1] COVID-19 diagnosed AND [2] has mild nausea, vomiting or diarrhea  Answer Assessment - Initial Assessment Questions 1. COVID-19 DIAGNOSIS: "Who made your Coronavirus (COVID-19) diagnosis?" "Was it confirmed by a positive lab test?" If not diagnosed by a HCP, ask "Are there lots of cases (community spread) where you live?" (See public health department website, if unsure)     Has not been tested for covid yet 2. COVID-19 EXPOSURE: "Was there any known exposure to COVID before the symptoms began?" CDC Definition of close contact: within 6 feet (2 meters) for a total of 15 minutes or more over a 24-hour period.      no 3. ONSET: "When did the COVID-19 symptoms start?"      Yesterday AM 4. WORST SYMPTOM: "What is your worst symptom?" (e.g., cough, fever, shortness of breath, muscle aches)     Headache 5. COUGH: "Do you have a cough?" If so, ask: "How bad is the cough?"       No cough 6. FEVER: "Do you have a fever?" If so, ask: "What is your temperature, how was it measured, and when did it start?"     no 7. RESPIRATORY STATUS: "Describe your breathing?" (e.g., shortness of breath, wheezing, unable to speak)      none 8. BETTER-SAME-WORSE: "Are you getting better, staying the same or getting worse  compared to yesterday?"  If getting worse, ask, "In what way?"     Nausea better- light hurts eyes headache and nausea 9. HIGH RISK DISEASE: "Do you have any chronic medical problems?" (e.g., asthma, heart or lung disease, weak immune system, obesity, etc.)     RA,Thyroid Dx ,Asthma 10. PREGNANCY: "Is there any chance you are pregnant?" "When was your last menstrual period?"       No-LMP: no period for 8 years 9 years S/P ablation 11. OTHER SYMPTOMS: "Do you have any other symptoms?"  (e.g., chills, fatigue, headache, loss of smell or taste, muscle pain, sore throat; new loss of smell or taste especially support the diagnosis of COVID-19)       Headache, fatigue,sore throat,nausea  Protocols used: CORONAVIRUS (COVID-19) DIAGNOSED OR SUSPECTED-A-AH

## 2020-05-29 ENCOUNTER — Emergency Department: Payer: 59

## 2020-05-29 ENCOUNTER — Emergency Department
Admission: EM | Admit: 2020-05-29 | Discharge: 2020-05-29 | Disposition: A | Discharge status: Home / Self Care | Payer: 59 | Attending: Emergency Medicine | Admitting: Emergency Medicine

## 2020-05-29 ENCOUNTER — Other Ambulatory Visit: Payer: Self-pay

## 2020-05-29 DIAGNOSIS — E039 Hypothyroidism, unspecified: Secondary | ICD-10-CM | POA: Insufficient documentation

## 2020-05-29 DIAGNOSIS — J45909 Unspecified asthma, uncomplicated: Secondary | ICD-10-CM | POA: Insufficient documentation

## 2020-05-29 DIAGNOSIS — R0981 Nasal congestion: Secondary | ICD-10-CM | POA: Insufficient documentation

## 2020-05-29 DIAGNOSIS — Z20822 Contact with and (suspected) exposure to covid-19: Secondary | ICD-10-CM | POA: Insufficient documentation

## 2020-05-29 DIAGNOSIS — R0600 Dyspnea, unspecified: Secondary | ICD-10-CM

## 2020-05-29 DIAGNOSIS — R0602 Shortness of breath: Secondary | ICD-10-CM | POA: Diagnosis present

## 2020-05-29 LAB — BASIC METABOLIC PANEL
Anion gap: 10 (ref 5–15)
BUN: 11 mg/dL (ref 6–20)
CO2: 24 mmol/L (ref 22–32)
Calcium: 9.2 mg/dL (ref 8.9–10.3)
Chloride: 103 mmol/L (ref 98–111)
Creatinine, Ser: 0.81 mg/dL (ref 0.44–1.00)
GFR calc Af Amer: >60 mL/min (ref >60)
GFR calc non Af Amer: >60 mL/min (ref >60)
Glucose, Bld: 90 mg/dL (ref 70–99)
Potassium: 3.9 mmol/L (ref 3.5–5.1)
Sodium: 137 mmol/L (ref 135–145)

## 2020-05-29 LAB — CBC WITH DIFFERENTIAL/PLATELET
Abs Immature Granulocytes: 0.02 10*3/uL (ref 0.00–0.07)
Basophils Absolute: 0 10*3/uL (ref 0.0–0.1)
Basophils Relative: 1 %
Eosinophils Absolute: 0.2 10*3/uL (ref 0.0–0.5)
Eosinophils Relative: 3 %
HCT: 42.8 % (ref 36.0–46.0)
Hemoglobin: 14.6 g/dL (ref 12.0–15.0)
Immature Granulocytes: 0 %
Lymphocytes Relative: 40 %
Lymphs Abs: 3.1 10*3/uL (ref 0.7–4.0)
MCH: 27 pg (ref 26.0–34.0)
MCHC: 34.1 g/dL (ref 30.0–36.0)
MCV: 79.3 fL — ABNORMAL LOW (ref 80.0–100.0)
Monocytes Absolute: 0.8 10*3/uL (ref 0.1–1.0)
Monocytes Relative: 10 %
Neutro Abs: 3.5 10*3/uL (ref 1.7–7.7)
Neutrophils Relative %: 46 %
Platelets: 296 10*3/uL (ref 150–400)
RBC: 5.4 MIL/uL — ABNORMAL HIGH (ref 3.87–5.11)
RDW: 13.6 % (ref 11.5–15.5)
WBC: 7.6 10*3/uL (ref 4.0–10.5)
nRBC: 0 % (ref 0.0–0.2)

## 2020-05-29 LAB — SARS CORONAVIRUS 2 BY RT PCR (HOSPITAL ORDER, PERFORMED IN ~~LOC~~ HOSPITAL LAB): SARS Coronavirus 2: NEGATIVE

## 2020-05-29 LAB — FIBRIN DERIVATIVES D-DIMER (ARMC ONLY): Fibrin derivatives D-dimer (ARMC): 273.16 ng/mL (FEU) (ref 0.00–499.00)

## 2020-05-29 LAB — TROPONIN I (HIGH SENSITIVITY): Troponin I (High Sensitivity): 3 ng/L (ref <18)

## 2020-05-29 MED ORDER — ALBUTEROL SULFATE (2.5 MG/3ML) 0.083% IN NEBU
5.0000 mg | INHALATION_SOLUTION | Freq: Once | RESPIRATORY_TRACT | Status: AC
  Administered 2020-05-29: 5 mg via RESPIRATORY_TRACT
  Filled 2020-05-29: qty 6

## 2020-05-29 NOTE — ED Provider Notes (Signed)
ER Provider Note       Time seen: 5:17 PM    I have reviewed the vital signs and the nursing notes.  HISTORY   Chief Complaint Shortness of Breath    HPI Carrie Santiago is a 49 y.o. female with a history of asthma, heart palpitations, hypothyroidism, iron deficiency anemia, rheumatoid arthritis who presents today for some breath that started around 2:00.  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  She does not feel like this is asthma.  She recently had a infusion for her rheumatoid arthritis and is concerned about being immunocompromise.  She has had some sinus congestion.  Past Medical History:  Diagnosis Date  . Allergic rhinitis due to allergen   . Asthma   . Heart palpitations OCCASIONAL  SECONDARY TO HYPOTHYROIDISM  . History of kidney stones   . Hypothyroidism   . Iron deficiency anemia   . Mild acid reflux   . RA (rheumatoid arthritis) (HCC) FOLLOWED BY DR HAWKS  . Right flank pain   . Right ureteral stone     Past Surgical History:  Procedure Laterality Date  . HYSTEROSCOPY W/ ENDOMETRIAL ABLATION  DEC 2011  . PERCUTANEOUS NEPHROSTOLITHOTOMY  2007  . RIGHT ELBOW SURGERY  06-01-2010   SEVERE RA WITH EXTENSIVE SYNOVITIS/ RADIOCAPITELLAR ARTHROSIS  . TUBAL LIGATION  1998  . WISDOM TOOTH EXTRACTION  2010    Allergies Shrimp [shellfish allergy]   Review of Systems Constitutional: Negative for fever. HEENT: Positive for sinus congestion Cardiovascular: Positive for chest pressure Respiratory: Positive for shortness of breath Gastrointestinal: Negative for abdominal pain, vomiting and diarrhea. Musculoskeletal: Negative for back pain. Skin: Negative for rash. Neurological: Negative for headaches, focal weakness or numbness.  All systems negative/normal/unremarkable except as stated in the HPI  ____________________________________________   PHYSICAL EXAM:  VITAL  SIGNS: Vitals:   05/29/20 1425  BP: (!) 149/93  Pulse: 87  Resp: (!) 22  Temp: 97.9 F (36.6 C)  SpO2: 100%    Constitutional: Alert and oriented. Well appearing and in no distress. Eyes: Conjunctivae are normal. Normal extraocular movements. ENT      Head: Normocephalic and atraumatic.      Nose: No congestion/rhinnorhea.      Mouth/Throat: Mucous membranes are moist.      Neck: No stridor. Cardiovascular: Normal rate, regular rhythm. No murmurs, rubs, or gallops. Respiratory: Normal respiratory effort without tachypnea nor retractions. Breath sounds are clear and equal bilaterally. No wheezes/rales/rhonchi. Gastrointestinal: Soft and nontender. Normal bowel sounds Musculoskeletal: Nontender with normal range of motion in extremities. No lower extremity tenderness nor edema. Neurologic:  Normal speech and language. No gross focal neurologic deficits are appreciated.  Skin:  Skin is warm, dry and intact. No rash noted. Psychiatric: Speech and behavior are normal.  ____________________________________________  EKG: Interpreted by me.  Sinus rhythm with rate of 90 bpm, normal PR interval, normal QRS, normal QT  ____________________________________________   LABS (pertinent positives/negatives)  Labs Reviewed  CBC WITH DIFFERENTIAL/PLATELET - Abnormal; Notable for the following components:      Result Value   RBC 5.40 (*)    MCV 79.3 (*)    All other components within normal limits  SARS CORONAVIRUS 2 BY RT PCR (HOSPITAL ORDER, PERFORMED IN [facility])  BASIC METABOLIC PANEL  FIBRIN DERIVATIVES D-DIMER ([facility] ONLY)  TROPONIN I (HIGH SENSITIVITY)  TROPONIN I (HIGH SENSITIVITY)    RADIOLOGY  Images were viewed by me Chest x-ray is unremarkable  DIFFERENTIAL DIAGNOSIS  Musculoskeletal pain, GERD, anxiety, PE, MI  ASSESSMENT AND PLAN  Dyspnea   Plan: The patient had presented for dyspnea with chest pressure. Patient's labs were reassuring.  No clear  etiology for her symptoms she has tested negative with a D-dimer, 2 troponins and a Covid test.  She is cleared for outpatient follow-up.  Daryel November MD    Note: This note was generated in part or whole with voice recognition software. Voice recognition is usually quite accurate but there are transcription errors that can and very often do occur. I apologize for any typographical errors that were not detected and corrected.     Emily Filbert, MD 05/29/20 [phone]

## 2020-05-29 NOTE — ED Notes (Signed)
This RN walked into triage room to check on patient after getting neb tx. PT reports she feels tingly and shaky. Pt breathing shallow and fast, O2 100% on RA. Advised pt to breathe in through the nose and out of the mouth.

## 2020-05-29 NOTE — ED Triage Notes (Addendum)
Pt arrives via POV from home for shob that started around 1400. 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 PT taking long slow deep breaths, no audible wheezing heard. PT states she has asthma and has an inhaler but it is not in her possession. PT reports her asthma attacks do not usually come on this quickly. Pt also reports she has RA in which she got an infusion for this past Monday so she is concerned about being immunocompromised.

## 2020-05-29 NOTE — ED Notes (Signed)
No peripheral IV placed this visit.    Discharge instructions reviewed with patient. Questions fielded by this RN. Patient verbalizes understanding of instructions. Patient discharged home in stable condition per provider. No acute distress noted at time of discharge.    

## 2020-12-02 ENCOUNTER — Ambulatory Visit
Admission: RE | Admit: 2020-12-02 | Discharge: 2020-12-02 | Disposition: A | Discharge status: Home / Self Care | Payer: 59 | Source: Ambulatory Visit | Attending: Emergency Medicine | Admitting: Emergency Medicine

## 2020-12-02 ENCOUNTER — Other Ambulatory Visit: Payer: Self-pay

## 2020-12-02 ENCOUNTER — Ambulatory Visit
Admission: RE | Admit: 2020-12-02 | Discharge: 2020-12-02 | Disposition: A | Discharge status: Home / Self Care | Payer: 59 | Attending: Emergency Medicine | Admitting: Emergency Medicine

## 2020-12-02 ENCOUNTER — Other Ambulatory Visit: Payer: Self-pay | Admitting: Emergency Medicine

## 2020-12-02 DIAGNOSIS — R059 Cough, unspecified: Secondary | ICD-10-CM | POA: Diagnosis not present

## 2021-04-05 ENCOUNTER — Ambulatory Visit
Admission: EM | Admit: 2021-04-05 | Discharge: 2021-04-05 | Disposition: A | Discharge status: Home / Self Care | Payer: 59

## 2021-04-05 ENCOUNTER — Other Ambulatory Visit: Payer: Self-pay

## 2021-04-05 DIAGNOSIS — J069 Acute upper respiratory infection, unspecified: Secondary | ICD-10-CM

## 2021-04-05 NOTE — Discharge Instructions (Signed)
Continue to use your albuterol inhaler as needed.  The emergency department if you have shortness of breath or other concerning symptoms.    Take Tylenol or ibuprofen as needed for fever or discomfort.  Take plain over-the-counter Mucinex as needed for congestion.    Follow up with your primary care provider if your symptoms are not improving.

## 2021-04-05 NOTE — ED Provider Notes (Signed)
[name]    CSN: [id number] Arrival date & time: 04/05/21  1231      History   Chief Complaint No chief complaint on file.   HPI Carrie Santiago is a 50 y.o. female.   Patient presents with 2-day history of cough and chest congestion.  She denies fever, chills, earache, sore throat, shortness of breath, wheezing, vomiting, diarrhea, or other symptoms.  Her medical history includes asthma and seasonal allergies.  She declines COVID test today.  The history is provided by the patient.    Past Medical History:  Diagnosis Date  . Allergic rhinitis due to allergen   . Asthma   . Heart palpitations OCCASIONAL  SECONDARY TO HYPOTHYROIDISM  . History of kidney stones   . Hypothyroidism   . Iron deficiency anemia   . Mild acid reflux   . RA (rheumatoid arthritis) (HCC) FOLLOWED BY DR HAWKS  . Right flank pain   . Right ureteral stone     Patient Active Problem List   Diagnosis Date Noted  . Closed fracture of metatarsal bone(s) 10/03/2013    Past Surgical History:  Procedure Laterality Date  . HYSTEROSCOPY W/ ENDOMETRIAL ABLATION  DEC 2011  . PERCUTANEOUS NEPHROSTOLITHOTOMY  2007  . RIGHT ELBOW SURGERY  06-01-2010   SEVERE RA WITH EXTENSIVE SYNOVITIS/ RADIOCAPITELLAR ARTHROSIS  . TUBAL LIGATION  1998  . WISDOM TOOTH EXTRACTION  2010    OB History   No obstetric history on file.      Home Medications    Prior to Admission medications   Medication Sig Start Date End Date Taking? Authorizing Provider  Albuterol (VENTOLIN IN) Inhale into the lungs as needed.     [provider]  calcium carbonate (TUMS - DOSED IN MG ELEMENTAL CALCIUM) 500 MG chewable tablet Chew 1 tablet by mouth as needed.     [provider]  CALCIUM PO Take by mouth 2 (two) times daily.    [provider]  Casanthranol-Docusate Sodium 30-100 MG CAPS Take by mouth as needed.     [provider]  Cholecalciferol (VITAMIN D PO) Take by mouth daily.     [provider]  cycloSPORINE (RESTASIS) 0.05 % ophthalmic emulsion Place 1 drop into both eyes 2 (two) times daily.    [provider]  doxycycline (VIBRAMYCIN) 100 MG capsule Take 100 mg by mouth 2 (two) times daily.    [provider]  Fluticasone-Salmeterol (ADVAIR DISKUS IN) Inhale 2 puffs into the lungs 2 (two) times daily.     [provider]  folic acid (FOLVITE) 1 MG tablet Take 1 mg by mouth daily.    [provider]  hydroxychloroquine (PLAQUENIL) 200 MG tablet Take 200 mg by mouth daily.     [provider]  InFLIXimab (REMICADE IV) Inject into the vein. Once every two weeks    [provider]  leflunomide (ARAVA) 20 MG tablet Take 20 mg by mouth daily.    [provider]  levothyroxine (SYNTHROID, LEVOTHROID) 150 MCG tablet Take 175 mcg by mouth every morning.     [provider]  methotrexate 1 G injection Inject 50 mg into the vein once a week.     [provider]  montelukast (SINGULAIR) 10 MG tablet Take 10 mg by mouth at bedtime.    [provider]  nitrofurantoin (MACRODANTIN) 100 MG capsule Take 100 mg by mouth 2 (two) times daily.     [provider]  oxyCODONE-acetaminophen (PERCOCET/ROXICET)  5-325 MG per tablet Take 1 tablet by mouth every 4 (four) hours as needed. OXYCODONE 10/325    [provider]  Oxymetazoline HCl (QC NASAL RELIEF SINUS NA) Place 80 mcg into the nose 2 (two) times daily.    [provider]  promethazine (PHENERGAN) 25 MG tablet Take 25 mg by mouth every 6 (six) hours as needed.    [provider]    Family History Family History  Problem Relation Age of Onset  . Breast cancer Paternal Aunt     Social History Social History   Tobacco Use  . Smoking status: Former Smoker    Packs/day: 0.25    Years: 4.00    Pack years: 1.00    Types: Cigarettes  . Smokeless tobacco: Never Used  Vaping Use  . Vaping Use:  Never used  Substance Use Topics  . Alcohol use: No  . Drug use: No     Allergies   Shrimp [shellfish allergy] and Sulfa antibiotics   Review of Systems Review of Systems  Constitutional: Negative for chills and fever.  HENT: Positive for congestion. Negative for ear pain and sore throat.   Respiratory: Positive for cough. Negative for shortness of breath.   Cardiovascular: Negative for chest pain and palpitations.  Gastrointestinal: Negative for abdominal pain, diarrhea and vomiting.  Skin: Negative for color change and rash.  All other systems reviewed and are negative.    Physical Exam Triage Vital Signs ED Triage Vitals  Enc Vitals Group     BP 04/05/21 1310 137/90     Pulse Rate 04/05/21 1310 67     Resp 04/05/21 1308 16     Temp 04/05/21 1310 97.7 F (36.5 C)     Temp Source 04/05/21 1310 Oral     SpO2 04/05/21 1310 96 %     Weight 04/05/21 1304 221 lb (100.2 kg)     Height --      Head Circumference --      Peak Flow --      Pain Score 04/05/21 1259 0     Pain Loc --      Pain Edu? --      Excl. in GC? --    No data found.  Updated Vital Signs BP 137/90 (BP Location: Left Arm)   Pulse 67   Temp 97.7 F (36.5 C) (Oral)   Resp 16   Wt 221 lb (100.2 kg)   SpO2 96%   BMI 37.93 kg/m   Visual Acuity Right Eye Distance:   Left Eye Distance:   Bilateral Distance:    Right Eye Near:   Left Eye Near:    Bilateral Near:     Physical Exam Vitals and nursing note reviewed.  Constitutional:      General: She is not in acute distress.    Appearance: She is well-developed. She is not ill-appearing.  HENT:     Head: Normocephalic and atraumatic.     Right Ear: Tympanic membrane normal.     Left Ear: Tympanic membrane normal.     Nose: Nose normal.     Mouth/Throat:     Mouth: Mucous membranes are moist.     Pharynx: Oropharynx is clear.  Eyes:     Conjunctiva/sclera: Conjunctivae normal.  Cardiovascular:     Rate and Rhythm: Normal rate and  regular rhythm.     Heart sounds: Normal heart sounds.  Pulmonary:     Effort: Pulmonary effort is normal. No respiratory distress.  Breath sounds: Normal breath sounds. No wheezing, rhonchi or rales.  Abdominal:     Palpations: Abdomen is soft.     Tenderness: There is no abdominal tenderness.  Musculoskeletal:     Cervical back: Neck supple.  Skin:    General: Skin is warm and dry.  Neurological:     General: No focal deficit present.     Mental Status: She is alert and oriented to person, place, and time.     Gait: Gait normal.  Psychiatric:        Mood and Affect: Mood normal.        Behavior: Behavior normal.      UC Treatments / Results  Labs (all labs ordered are listed, but only abnormal results are displayed) Labs Reviewed - No data to display  EKG   Radiology No results found.  Procedures Procedures (including critical care time)  Medications Ordered in UC Medications - No data to display  Initial Impression / Assessment and Plan / UC Course  I have reviewed the triage vital signs and the nursing notes.  Pertinent labs & imaging results that were available during my care of the patient were reviewed by me and considered in my medical decision making (see chart for details).   Viral URI with cough.  Patient declines COVID test.  She is well-appearing and her exam is reassuring.  No wheezes.  O2 sat 96%.  Instructed her to continue using her albuterol inhaler as needed and to go to the emergency department if she has acute shortness of breath or other concerning symptoms.  Discussed other symptomatic treatment.  Instructed her to follow-up with her PCP if she is not improving.  She agrees to plan of care.   Final Clinical Impressions(s) / UC Diagnoses   Final diagnoses:  Viral URI with cough     Discharge Instructions     Continue to use your albuterol inhaler as needed.  The emergency department if you have shortness of breath or other concerning  symptoms.    Take Tylenol or ibuprofen as needed for fever or discomfort.  Take plain over-the-counter Mucinex as needed for congestion.    Follow up with your primary care provider if your symptoms are not improving.        ED Prescriptions    None     PDMP not reviewed this encounter.   Mickie Bail, NP 04/05/21 1341

## 2021-04-05 NOTE — ED Triage Notes (Signed)
Patient presents to Urgent Care with complaints of chest congestion x 2 days. Pt treating symptoms with mucinex and allergy meds. Has a hx of asthma, seasonal allergies. Pt states concerned with bronchitis.     Denies fever.

## 2021-06-14 ENCOUNTER — Ambulatory Visit: Admit: 2021-06-14 | Payer: 59

## 2021-06-15 ENCOUNTER — Other Ambulatory Visit (HOSPITAL_COMMUNITY): Payer: Self-pay | Admitting: Anesthesiology

## 2021-06-15 ENCOUNTER — Other Ambulatory Visit: Payer: Self-pay | Admitting: Anesthesiology

## 2021-06-15 DIAGNOSIS — M5412 Radiculopathy, cervical region: Secondary | ICD-10-CM

## 2021-06-24 ENCOUNTER — Other Ambulatory Visit: Payer: Self-pay

## 2021-06-24 ENCOUNTER — Ambulatory Visit
Admission: RE | Admit: 2021-06-24 | Discharge: 2021-06-24 | Disposition: A | Discharge status: Home / Self Care | Payer: 59 | Source: Ambulatory Visit | Attending: Anesthesiology | Admitting: Anesthesiology

## 2021-06-24 DIAGNOSIS — M5412 Radiculopathy, cervical region: Secondary | ICD-10-CM

## 2022-03-16 ENCOUNTER — Other Ambulatory Visit: Payer: Self-pay | Admitting: Anesthesiology

## 2022-03-16 DIAGNOSIS — M545 Low back pain, unspecified: Secondary | ICD-10-CM

## 2022-03-18 ENCOUNTER — Other Ambulatory Visit: Payer: Self-pay | Admitting: Family Medicine

## 2022-03-18 DIAGNOSIS — Z1231 Encounter for screening mammogram for malignant neoplasm of breast: Secondary | ICD-10-CM

## 2022-03-30 ENCOUNTER — Ambulatory Visit
Admission: RE | Admit: 2022-03-30 | Discharge: 2022-03-30 | Disposition: A | Discharge status: Home / Self Care | Payer: 59 | Source: Ambulatory Visit | Attending: Anesthesiology | Admitting: Anesthesiology

## 2022-03-30 DIAGNOSIS — M545 Low back pain, unspecified: Secondary | ICD-10-CM | POA: Insufficient documentation

## 2022-04-20 ENCOUNTER — Ambulatory Visit
Admission: RE | Admit: 2022-04-20 | Discharge: 2022-04-20 | Disposition: A | Discharge status: Home / Self Care | Payer: 59 | Source: Ambulatory Visit | Attending: Family Medicine | Admitting: Family Medicine

## 2022-04-20 DIAGNOSIS — Z1231 Encounter for screening mammogram for malignant neoplasm of breast: Secondary | ICD-10-CM | POA: Diagnosis present

## 2022-05-08 ENCOUNTER — Ambulatory Visit
Admission: EM | Admit: 2022-05-08 | Discharge: 2022-05-08 | Disposition: A | Discharge status: Home / Self Care | Payer: 59 | Attending: Urgent Care | Admitting: Urgent Care

## 2022-05-08 ENCOUNTER — Encounter: Payer: Self-pay | Admitting: Emergency Medicine

## 2022-05-08 DIAGNOSIS — R0981 Nasal congestion: Secondary | ICD-10-CM

## 2022-05-08 DIAGNOSIS — H15101 Unspecified episcleritis, right eye: Secondary | ICD-10-CM

## 2022-05-08 DIAGNOSIS — H5789 Other specified disorders of eye and adnexa: Secondary | ICD-10-CM | POA: Diagnosis not present

## 2022-05-08 MED ORDER — ERYTHROMYCIN 5 MG/GM OP OINT: TOPICAL_OINTMENT | OPHTHALMIC | 0 refills | Status: AC

## 2022-05-08 MED ORDER — PREDNISONE 50 MG PO TABS: 50.0000 mg | ORAL_TABLET | Freq: Every day | ORAL | 0 refills | Status: AC

## 2022-05-08 MED ORDER — DEXAMETHASONE SODIUM PHOSPHATE 10 MG/ML IJ SOLN
10.0000 mg | Freq: Once | INTRAMUSCULAR | Status: AC
  Administered 2022-05-08: 10 mg via INTRAMUSCULAR

## 2022-05-08 NOTE — ED Provider Notes (Signed)
 Carrie Santiago    CSN: [id number] Arrival date & time: 05/08/22  4098      History   Chief Complaint Chief Complaint  Patient presents with   Eye Irritation    Sinus Pressure    HPI Carrie Santiago is a 51 y.o. female.   Pleasant 51 year old female presents today with concerns of redness of her right eye, and sinus pressure.  She recently came back from the beach, and states that the redness occurred while on vacation.  She states it is uncomfortable, but denies pain.  She denies photophobia.  She denies change in vision or blurred vision.  She states there was a mild yellow drainage from her eye.  It has been present for over 6 days.  She also reports for the past 4 days sinus pressure, primarily to her maxillary and frontal sinus on the right.  She has a history of migraines so tried a Nurtec, but states it did not help with her sinus pressure.  She denies fever.  She does have a history of rheumatoid arthritis, takes prednisone intermittently for flareups, but states her last flare was in December 2022.  She denies any sore throat, significant postnasal drainage, or swelling of her eyelid.     Past Medical History:  Diagnosis Date   Allergic rhinitis due to allergen    Asthma    Heart palpitations OCCASIONAL  SECONDARY TO HYPOTHYROIDISM   History of kidney stones    Hypothyroidism    Iron deficiency anemia    Mild acid reflux    RA (rheumatoid arthritis) (HCC) FOLLOWED BY DR HAWKS   Right flank pain    Right ureteral stone     Patient Active Problem List   Diagnosis Date Noted   Closed fracture of metatarsal bone(s) 10/03/2013    Past Surgical History:  Procedure Laterality Date   HYSTEROSCOPY W/ ENDOMETRIAL ABLATION  DEC 2011   PERCUTANEOUS NEPHROSTOLITHOTOMY  2007   RIGHT ELBOW SURGERY  06-01-2010   SEVERE RA WITH EXTENSIVE SYNOVITIS/ RADIOCAPITELLAR ARTHROSIS   TUBAL LIGATION  1998   WISDOM TOOTH EXTRACTION  2010    OB History   No obstetric  history on file.      Home Medications    Prior to Admission medications   Medication Sig Start Date End Date Taking? Authorizing Provider  erythromycin ophthalmic ointment Place a 1/2 inch ribbon of ointment into the lower R eyelid three times daily x 5 days 05/08/22  Yes Bengie Kaucher,  L, PA  predniSONE (DELTASONE) 50 MG tablet Take 1 tablet (50 mg total) by mouth daily with breakfast for 5 days. 05/08/22 05/13/22 Yes Averie Hornbaker,  L, PA  Albuterol (VENTOLIN IN) Inhale into the lungs as needed.     [provider]  calcium carbonate (TUMS - DOSED IN MG ELEMENTAL CALCIUM) 500 MG chewable tablet Chew 1 tablet by mouth as needed.     [provider]  CALCIUM PO Take by mouth 2 (two) times daily.    [provider]  Casanthranol-Docusate Sodium 30-100 MG CAPS Take by mouth as needed.     [provider]  Cholecalciferol (VITAMIN D PO) Take by mouth daily.    [provider]  cycloSPORINE (RESTASIS) 0.05 % ophthalmic emulsion Place 1 drop into both eyes 2 (two) times daily.    [provider]  Fluticasone-Salmeterol (ADVAIR DISKUS IN) Inhale 2 puffs into the lungs 2 (two) times daily.     [provider]  folic acid (FOLVITE)  1 MG tablet Take 1 mg by mouth daily.    [provider]  hydroxychloroquine (PLAQUENIL) 200 MG tablet Take 200 mg by mouth daily.     [provider]  InFLIXimab (REMICADE IV) Inject into the vein. Once every two weeks    [provider]  levothyroxine (SYNTHROID, LEVOTHROID) 150 MCG tablet Take 175 mcg by mouth every morning.     [provider]  montelukast (SINGULAIR) 10 MG tablet Take 10 mg by mouth at bedtime.    [provider]    Family History Family History  Problem Relation Age of Onset   Breast cancer Paternal Aunt        3 times    Social History Social History   Tobacco Use   Smoking status: Former    Packs/day: 0.25    Years: 4.00    Total  pack years: 1.00    Types: Cigarettes   Smokeless tobacco: Never  Vaping Use   Vaping Use: Never used  Substance Use Topics   Alcohol use: No   Drug use: No     Allergies   Methotrexate, Shrimp [shellfish allergy], and Sulfa antibiotics   Review of Systems Review of Systems As per HPI  Physical Exam Triage Vital Signs ED Triage Vitals  Enc Vitals Group     BP 05/08/22 1012 (!) 143/87     Pulse Rate 05/08/22 1012 74     Resp 05/08/22 1012 18     Temp 05/08/22 1012 98.2 F (36.8 C)     Temp Source 05/08/22 1012 Oral     SpO2 05/08/22 1012 95 %     Weight --      Height --      Head Circumference --      Peak Flow --      Pain Score 05/08/22 1011 4     Pain Loc --      Pain Edu? --      Excl. in GC? --    No data found.  Updated Vital Signs BP (!) 143/87 (BP Location: Left Arm)   Pulse 74   Temp 98.2 F (36.8 C) (Oral)   Resp 18   SpO2 95%   Visual Acuity Right Eye Distance:   Left Eye Distance:   Bilateral Distance:    Right Eye Near:   Left Eye Near:    Bilateral Near:     Physical Exam Vitals and nursing note reviewed.  Constitutional:      General: She is not in acute distress.    Appearance: Normal appearance. She is obese. She is not ill-appearing, toxic-appearing or diaphoretic.  HENT:     Head: Normocephalic and atraumatic.     Jaw: No trismus or swelling.     Salivary Glands: Right salivary gland is not diffusely enlarged or tender. Left salivary gland is not diffusely enlarged or tender.     Right Ear: Tympanic membrane, ear canal and external ear normal. No middle ear effusion. Tympanic membrane is not injected or scarred.     Left Ear: Tympanic membrane, ear canal and external ear normal.  No middle ear effusion. Tympanic membrane is not injected or scarred.     Nose: No nasal tenderness or congestion.     Right Turbinates: Swollen.     Left Turbinates: Swollen.     Right Sinus: Maxillary sinus tenderness and frontal sinus tenderness  present.     Left Sinus: Frontal sinus tenderness present. No maxillary sinus tenderness.  Mouth/Throat:     Lips: Pink.     Mouth: Mucous membranes are moist.     Tongue: No lesions. Tongue does not deviate from midline.     Palate: No mass and lesions.     Pharynx: Oropharynx is clear. Uvula midline. No pharyngeal swelling, oropharyngeal exudate, posterior oropharyngeal erythema or uvula swelling.  Eyes:     General: Lids are normal. Vision grossly intact. Gaze aligned appropriately. No allergic shiner, visual field deficit or scleral icterus.       Right eye: Discharge (minimal scant discharge to upper eyelid on R only) present. No foreign body or hordeolum.        Left eye: No foreign body, discharge or hordeolum.     Extraocular Movements: Extraocular movements intact.     Conjunctiva/sclera:     Right eye: Right conjunctiva is injected. No chemosis, exudate or hemorrhage.    Left eye: Left conjunctiva is not injected. No chemosis, exudate or hemorrhage.     Comments: Medial episcleral injection only without deep involvement. No dark or violaceous coloring of medial eye NO ciliary flush  Neurological:     Mental Status: She is alert.      UC Treatments / Results  Labs (all labs ordered are listed, but only abnormal results are displayed) Labs Reviewed - No data to display  EKG   Radiology No results found.  Procedures Procedures (including critical care time)  Medications Ordered in UC Medications  dexamethasone (DECADRON) injection 10 mg (10 mg Intramuscular Given 05/08/22 1055)    Initial Impression / Assessment and Plan / UC Course  I have reviewed the triage vital signs and the nursing notes.  Pertinent labs & imaging results that were available during my care of the patient were reviewed by me and considered in my medical decision making (see chart for details).     Episcleritis R eye - most likely in setting of known RA. This does not look like a  typical conjunctivitis. Given the drainage, I will initiate topical abx therapy, however recommended pt f/u with ophthalmology and rheumatology ASAP. NSAIDs typically prescribed for this condition, however in the setting of severe sinus pressure as well, prednisone may be more beneficial. ER precautions strictly enforced, including severe eye pain or change in vision. There are no clinical s/sx for orbital cellulitis. Sinus congestion - sx x 4 days most consistent with viral sinusitis. Pt requesting steroid injection in office, has had before has had great response to therapy. Will do PO steroids starting tomorrow. Eye drainage - warm washcloths, eye ointment. Must follow up with specialist.  Final Clinical Impressions(s) / UC Diagnoses   Final diagnoses:  Episcleritis of right eye  Sinus congestion  Eye drainage     Discharge Instructions      I am concerned that your eye redness is due to episcleritis, a condition that can commonly be seen with rheumatoid arthritis. This is typically treated with oral NSAIDs, however given your sinus congestion as well, I would like to use high-dose prednisone. You were given a dexamethasone shot in office. Please start the oral prednisone tomorrow morning.  Do not take in the evening or will cause insomnia. Use the eye ointment to help with the discharge, but call your rheumatologist first thing tomorrow morning and also schedule an appointment with ophthalmology. If severe eye pain, fever, or blurred vision occurs, you must head to the ER immediately     ED Prescriptions     Medication Sig Dispense Auth.  Provider   predniSONE (DELTASONE) 50 MG tablet Take 1 tablet (50 mg total) by mouth daily with breakfast for 5 days. 5 tablet Kareli Hossain,  L, PA   erythromycin ophthalmic ointment Place a 1/2 inch ribbon of ointment into the lower R eyelid three times daily x 5 days 3.5 g Zonie Crutcher,  L, PA      PDMP not reviewed this encounter.   Maretta Bees, [place] 05/08/22 1310

## 2022-08-30 LAB — COLOGUARD: COLOGUARD: NEGATIVE

## 2023-07-24 ENCOUNTER — Other Ambulatory Visit: Payer: Self-pay | Admitting: Anesthesiology

## 2023-07-24 DIAGNOSIS — M545 Low back pain, unspecified: Secondary | ICD-10-CM

## 2023-07-31 ENCOUNTER — Ambulatory Visit
Admission: RE | Admit: 2023-07-31 | Discharge: 2023-07-31 | Disposition: A | Discharge status: Home / Self Care | Payer: 59 | Source: Ambulatory Visit | Attending: Anesthesiology | Admitting: Anesthesiology

## 2023-07-31 DIAGNOSIS — M545 Low back pain, unspecified: Secondary | ICD-10-CM | POA: Diagnosis present

## 2023-08-17 ENCOUNTER — Other Ambulatory Visit: Payer: Self-pay | Admitting: Family Medicine

## 2023-08-17 DIAGNOSIS — Z1231 Encounter for screening mammogram for malignant neoplasm of breast: Secondary | ICD-10-CM

## 2023-10-23 ENCOUNTER — Other Ambulatory Visit: Payer: Self-pay | Admitting: Anesthesiology

## 2023-10-23 ENCOUNTER — Ambulatory Visit
Admission: RE | Admit: 2023-10-23 | Discharge: 2023-10-23 | Disposition: A | Discharge status: Home / Self Care | Payer: 59 | Source: Ambulatory Visit | Attending: Anesthesiology | Admitting: Anesthesiology

## 2023-10-23 ENCOUNTER — Ambulatory Visit
Admission: RE | Admit: 2023-10-23 | Discharge: 2023-10-23 | Disposition: A | Discharge status: Home / Self Care | Payer: 59 | Attending: Anesthesiology | Admitting: Anesthesiology

## 2023-10-23 DIAGNOSIS — M25512 Pain in left shoulder: Secondary | ICD-10-CM

## 2023-11-01 ENCOUNTER — Ambulatory Visit
Admission: RE | Admit: 2023-11-01 | Discharge: 2023-11-01 | Disposition: A | Discharge status: Home / Self Care | Payer: 59 | Source: Ambulatory Visit | Attending: Family Medicine | Admitting: Family Medicine

## 2023-11-01 DIAGNOSIS — Z1231 Encounter for screening mammogram for malignant neoplasm of breast: Secondary | ICD-10-CM | POA: Insufficient documentation

## 2024-01-03 ENCOUNTER — Other Ambulatory Visit: Payer: Self-pay | Admitting: Pain Medicine

## 2024-01-03 DIAGNOSIS — R52 Pain, unspecified: Secondary | ICD-10-CM

## 2024-01-11 ENCOUNTER — Other Ambulatory Visit: Payer: 59

## 2024-01-15 ENCOUNTER — Ambulatory Visit
Admission: RE | Admit: 2024-01-15 | Discharge: 2024-01-15 | Disposition: A | Discharge status: Home / Self Care | Payer: 59 | Source: Ambulatory Visit | Attending: Pain Medicine | Admitting: Pain Medicine

## 2024-01-15 DIAGNOSIS — R52 Pain, unspecified: Secondary | ICD-10-CM | POA: Insufficient documentation

## 2024-10-24 ENCOUNTER — Other Ambulatory Visit: Payer: Self-pay | Admitting: Family Medicine

## 2024-10-24 DIAGNOSIS — Z1231 Encounter for screening mammogram for malignant neoplasm of breast: Secondary | ICD-10-CM

## 2024-11-27 ENCOUNTER — Ambulatory Visit
Admission: RE | Admit: 2024-11-27 | Discharge: 2024-11-27 | Disposition: A | Discharge status: Home / Self Care | Source: Ambulatory Visit | Attending: Family Medicine | Admitting: Family Medicine

## 2024-11-27 DIAGNOSIS — Z1231 Encounter for screening mammogram for malignant neoplasm of breast: Secondary | ICD-10-CM | POA: Insufficient documentation
